# Patient Record
Sex: Male | Born: 1960
Health system: Southern US, Community
[De-identification: ages and names within clinical notes are randomized; demographics above are authoritative.]

## PROBLEM LIST (undated history)

## (undated) DIAGNOSIS — N529 Male erectile dysfunction, unspecified: Secondary | ICD-10-CM

## (undated) DIAGNOSIS — G4730 Sleep apnea, unspecified: Secondary | ICD-10-CM

## (undated) DIAGNOSIS — R079 Chest pain, unspecified: Secondary | ICD-10-CM

## (undated) DIAGNOSIS — E785 Hyperlipidemia, unspecified: Secondary | ICD-10-CM

## (undated) DIAGNOSIS — I10 Essential (primary) hypertension: Secondary | ICD-10-CM

## (undated) DIAGNOSIS — E119 Type 2 diabetes mellitus without complications: Secondary | ICD-10-CM

## (undated) DIAGNOSIS — N189 Chronic kidney disease, unspecified: Secondary | ICD-10-CM

## (undated) DIAGNOSIS — B0223 Postherpetic polyneuropathy: Secondary | ICD-10-CM

## (undated) DIAGNOSIS — E559 Vitamin D deficiency, unspecified: Secondary | ICD-10-CM

## (undated) DIAGNOSIS — I509 Heart failure, unspecified: Secondary | ICD-10-CM

## (undated) HISTORY — DX: Male erectile dysfunction, unspecified: N52.9

## (undated) HISTORY — DX: Vitamin D deficiency, unspecified: E55.9

## (undated) HISTORY — DX: Sleep apnea, unspecified: G47.30

## (undated) HISTORY — DX: Chest pain, unspecified: R07.9

## (undated) HISTORY — DX: Chronic kidney disease, unspecified: N18.9

## (undated) HISTORY — DX: Postherpetic polyneuropathy: B02.23

## (undated) HISTORY — DX: Essential (primary) hypertension: I10

## (undated) HISTORY — DX: Hyperlipidemia, unspecified: E78.5

## (undated) HISTORY — DX: Morbid (severe) obesity due to excess calories: E66.01

## (undated) HISTORY — DX: Type 2 diabetes mellitus without complications: E11.9

---

## 1998-08-31 ENCOUNTER — Encounter: Admission: RE | Admit: 1998-08-31 | Discharge: 1998-11-29 | Payer: Self-pay | Admitting: Family Medicine

## 1999-04-27 ENCOUNTER — Encounter: Admission: RE | Admit: 1999-04-27 | Discharge: 1999-07-26 | Payer: Self-pay | Admitting: Internal Medicine

## 2005-01-15 ENCOUNTER — Inpatient Hospital Stay (HOSPITAL_COMMUNITY): Admission: EM | Admit: 2005-01-15 | Discharge: 2005-01-17 | Payer: Self-pay | Admitting: Emergency Medicine

## 2007-04-08 ENCOUNTER — Ambulatory Visit (HOSPITAL_COMMUNITY): Admission: RE | Admit: 2007-04-08 | Discharge: 2007-04-08 | Payer: Self-pay | Admitting: *Deleted

## 2007-04-19 ENCOUNTER — Encounter: Admission: RE | Admit: 2007-04-19 | Discharge: 2007-04-19 | Payer: Self-pay | Admitting: *Deleted

## 2007-05-07 ENCOUNTER — Ambulatory Visit (HOSPITAL_COMMUNITY): Admission: RE | Admit: 2007-05-07 | Discharge: 2007-05-07 | Payer: Self-pay | Admitting: *Deleted

## 2007-05-24 ENCOUNTER — Inpatient Hospital Stay (HOSPITAL_COMMUNITY): Admission: EM | Admit: 2007-05-24 | Discharge: 2007-05-28 | Payer: Self-pay | Admitting: Emergency Medicine

## 2007-11-13 HISTORY — PX: LAPAROSCOPIC GASTRIC BANDING: SHX1100

## 2007-12-03 ENCOUNTER — Encounter: Admission: RE | Admit: 2007-12-03 | Discharge: 2008-02-04 | Payer: Self-pay | Admitting: *Deleted

## 2007-12-09 ENCOUNTER — Ambulatory Visit (HOSPITAL_COMMUNITY): Admission: RE | Admit: 2007-12-09 | Discharge: 2007-12-10 | Payer: Self-pay | Admitting: *Deleted

## 2008-04-29 ENCOUNTER — Encounter: Admission: RE | Admit: 2008-04-29 | Discharge: 2008-04-29 | Payer: Self-pay | Admitting: *Deleted

## 2009-10-20 IMAGING — RF DG UGI W/ KUB
14 of 16 series · 14 of 16 positions shown · non-contrast
Comparison: none

CLINICAL DATA: Preop screening for bariatric surgery ? no G.I. symptoms. 
UPPER G.I. WITH KUB:
TECHNIQUE: Routine study although somewhat limited due to the patient?s size.

[Series 1: run · 1 of 1 slices shown (1 of 14)]
[im 1/1]
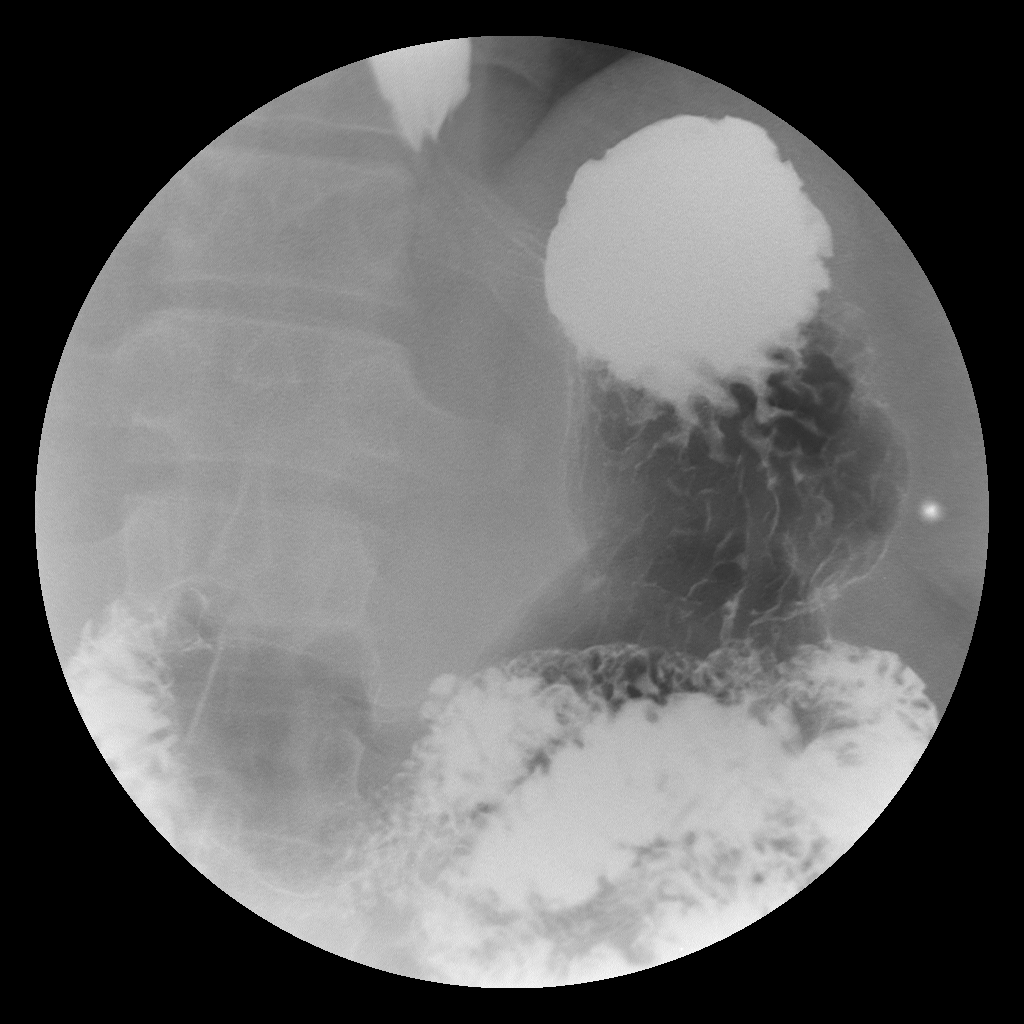

[Series 2: run · 1 of 1 slices shown (2 of 14)]
[im 1/1]
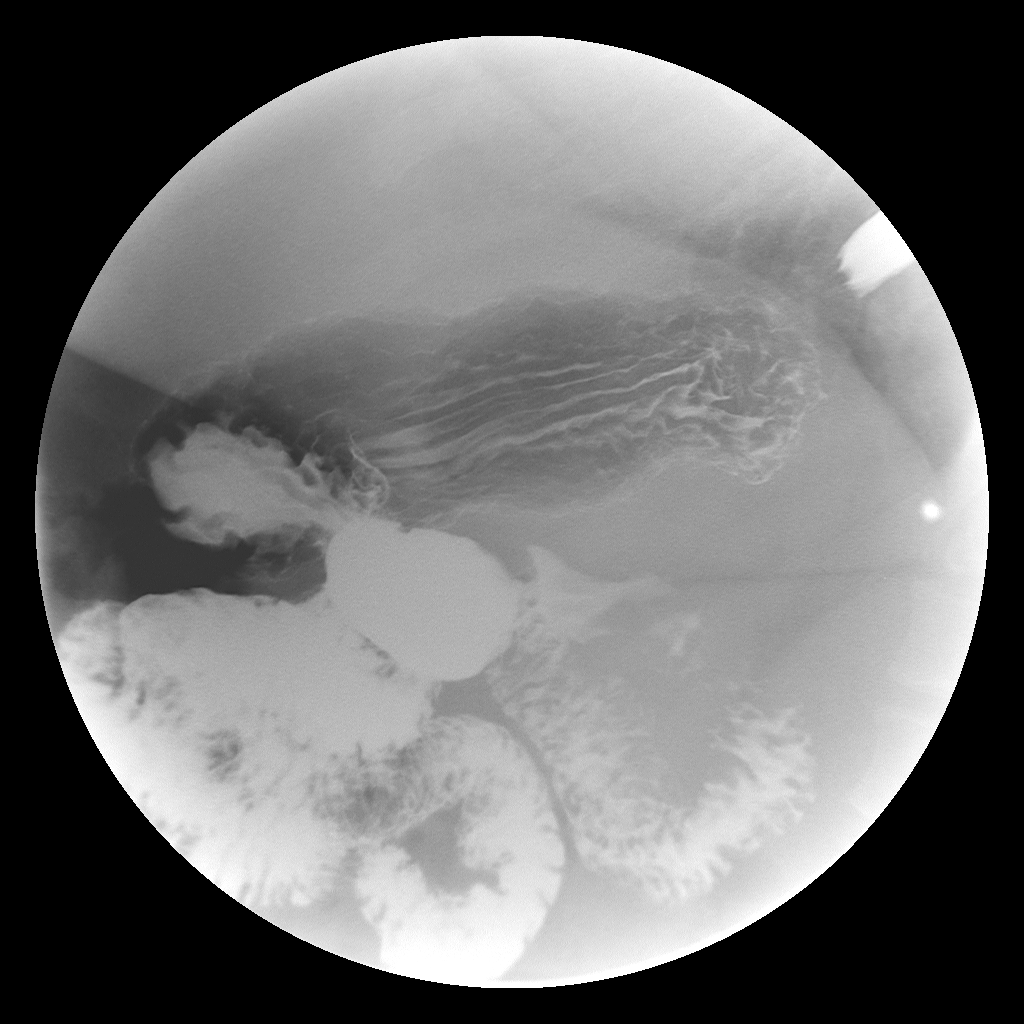

[Series 3: run · 1 of 1 slices shown (3 of 14)]
[im 1/1]
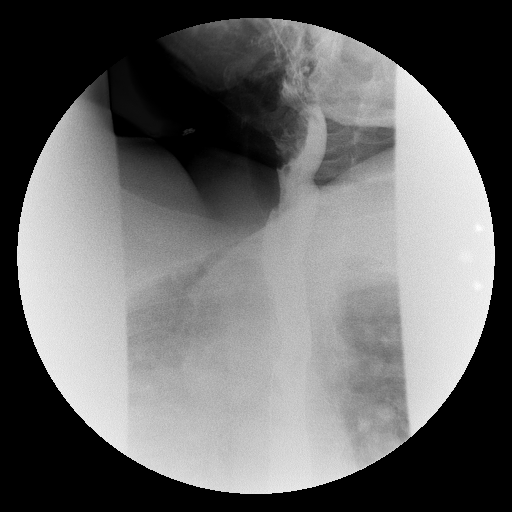

[Series 6: run · 1 of 1 slices shown (4 of 14)]
[im 1/1]
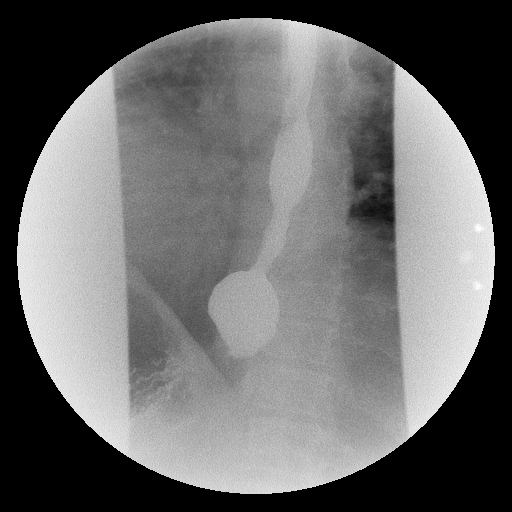

[Series 7: run · 1 of 1 slices shown (5 of 14)]
[im 1/1]
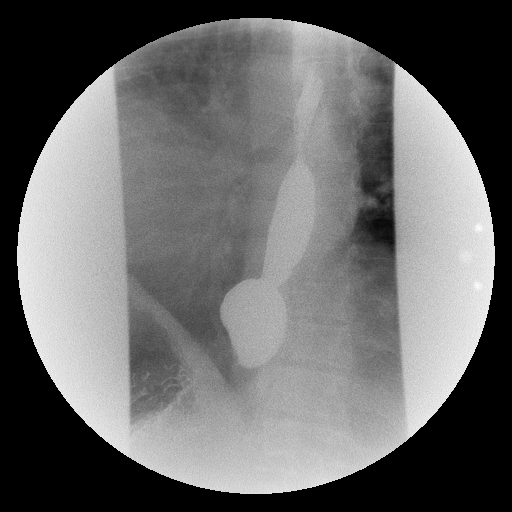

[Series 8: run · 1 of 1 slices shown (6 of 14)]
[im 1/1]
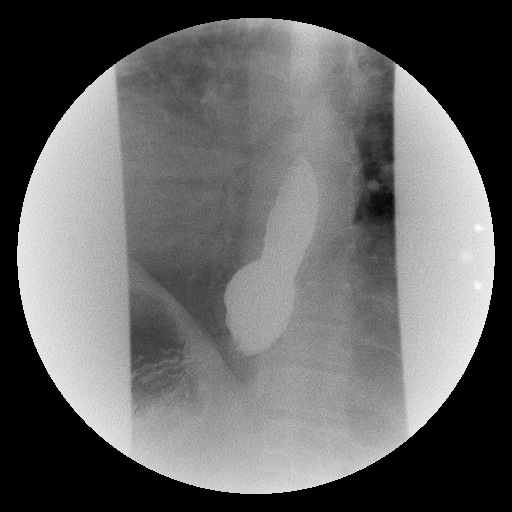

[Series 9: run · 1 of 1 slices shown (7 of 14)]
[im 1/1]
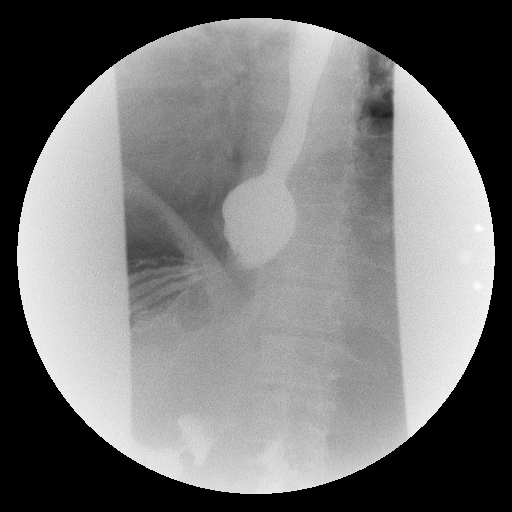

[Series 10: run · 1 of 1 slices shown (8 of 14)]
[im 1/1]
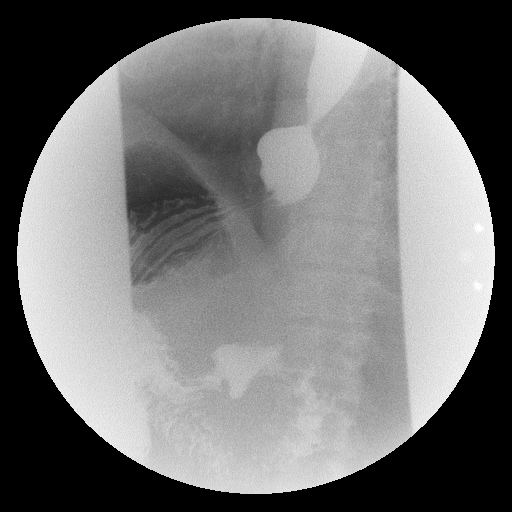

[Series 11: run · 1 of 1 slices shown (9 of 14)]
[im 1/1]
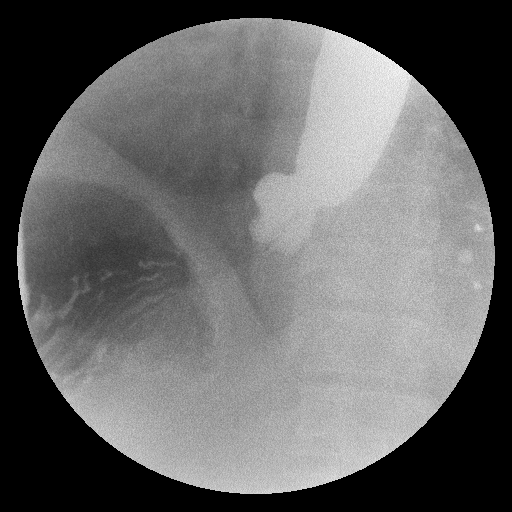

[Series 12: run · 1 of 1 slices shown (10 of 14)]
[im 1/1]
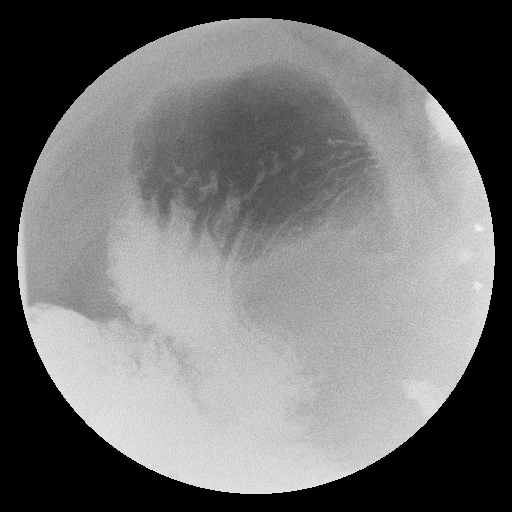

[Series 14: run · 1 of 1 slices shown (11 of 14)]
[im 1/1]
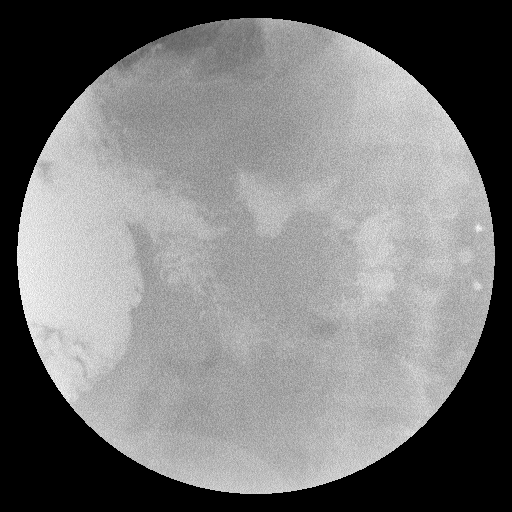

[Series 15: run · 1 of 1 slices shown (12 of 14)]
[im 1/1]
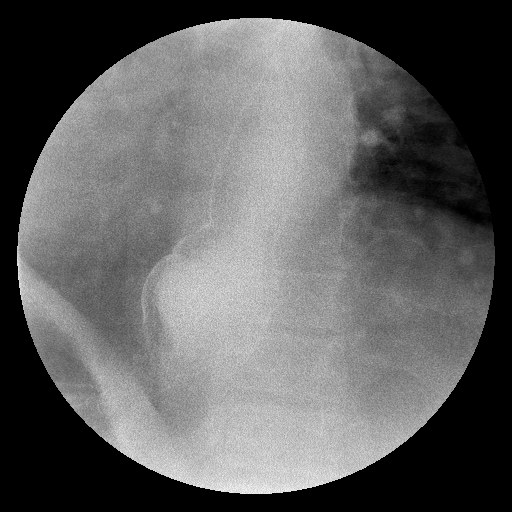

[Series 16: run · 1 of 1 slices shown (13 of 14)]
[im 1/1]
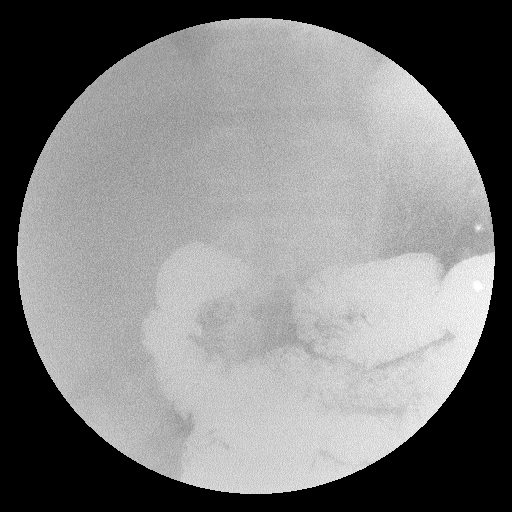

[Series 17: run · 1 of 1 slices shown (14 of 14)]
[im 1/1]
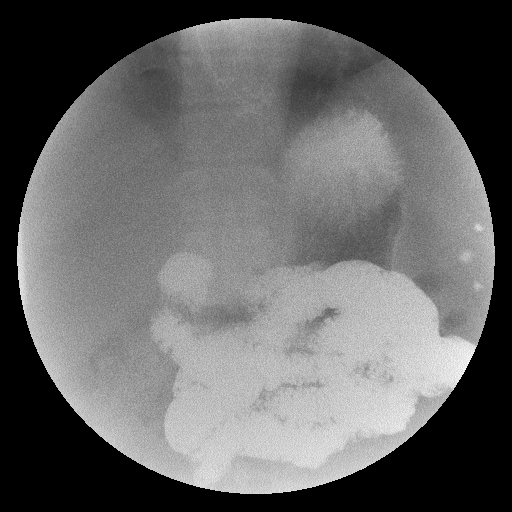

[14 of 16 positions shown; findings below may reference images not displayed]

FINDINGS: Swallowing mechanism normal.  There is rather severe esophageal dysmotility.  There is a small to moderate sized sliding hiatal hernia.  I was unable to demonstrate reflux, stricture, or esophagitis on this limited exam.  Patient?s weight prohibited placing the patient upright.  
The stomach, duodenal bulb, C-loop, and proximal small bowel are normal.
IMPRESSION: Small to moderate sized hiatal hernia with esophageal dysmotility ? no definite reflux, stricture, or esophagitis demonstrated.

## 2010-09-27 NOTE — Op Note (Signed)
NAME:  EYTHAN, JAYNE NO.:  [id number]   MEDICAL RECORD NO.:  [id number]          PATIENT TYPE:  INP   LOCATION:  0007                         FACILITY:  [facility]   PHYSICIAN:  Alfonse Ras, MD   DATE OF BIRTH:  [birth year]   DATE OF PROCEDURE:  12/09/2007  DATE OF DISCHARGE:                               OPERATIVE REPORT   PREOPERATIVE DIAGNOSES:  Medically refractory morbid obesity and  questionable hiatal hernia.   POSTOPERATIVE DIAGNOSES:  Medically refractory morbid obesity and no  evidence of hiatal hernia.   PROCEDURE:  Laparoscopic adjustable gastric banding with an APL system.   ASSISTANT:  Thornton Park. Daphine Deutscher, MD   ANESTHESIA:  General endotracheal tube.   DESCRIPTION:  The patient was taken to the operating room, placed in  supine position.  After adequate general anesthesia was induced using  endotracheal tube, the abdomen was prepped and draped in normal sterile  fashion using a 11 mm OptiVu trocar in the left upper quadrant under  direct vision, peritoneal access was obtained.  Pneumoperitoneum was  obtained.  A 15 mm trocar was placed in the right upper quadrant under  direct vision and an additional 11 mm trocar was placed in the right  abdomen.  A 11 mm trocar was placed in the right supraumbilical left  paramedian position.  Additional 5-mm trocar was placed in the left mid  abdomen.  Nathanson liver retractor was placed in subxiphoid region.  The left lateral segment liver was retracted.  The left lateral segment  appeared to be very thick and boggy and, therefore, I redirected a 50 mm  trocar, however, there was no significant torque and, therefore, I  placed the APL band in to the abdomen and then replaced the 15 mm port  with a 12 mm port placed along 12 mm port placed obliquely.  The angle  of Hiss was sharply and bluntly dissected.  Using the pars flaccida  technique, the area of crossing fat on the right crus of the diaphragm  was  identified.  Sizing balloon was placed down through the esophagus  and into the stomach and insufflated to 15 mL of air and pulled back.  There was no evidence of hiatal hernia and no anterior dimpling.  The  band passer was then placed using pars flaccida technique in the  retrogastric position and brought out at the angle of Hiss and the band  was secured in place over the sizing balloon.  Anterior fundoplication  was performed using interrupted 2-0 Ethilon sutures in the standard  fashion.  Nathanson liver retractor was removed.  Pneumoperitoneum was  released.  The tubing was brought out through the 11 mm trocar in the  right midabdomen attached to the port and secured to the anterior  abdominal wall fascia with interrupted 2-0 Prolene sutures.  After all  trocars were removed, skin incisions were closed with subcuticular for  Monocryl and injected with 0.5 Marcaine.  Sterile dressings were  applied.  The patient tolerated the procedure well, went to PACU in good  condition.      Kristen R.  Colin Benton, MD  Electronically Signed     KRE/MEDQ  D:  12/09/2007  T:  12/10/2007  Job:  [id number]

## 2010-09-27 NOTE — Discharge Summary (Signed)
NAME:  Todd Delgado, Todd Delgado NO.:  [id number]   MEDICAL RECORD NO.:  [id number]          PATIENT TYPE:  INP   LOCATION:  5155                         FACILITY:  [facility]   PHYSICIAN:  Isidor Holts, M.D.  DATE OF BIRTH:  27-Apr-1961   DATE OF ADMISSION:  05/23/2007  DATE OF DISCHARGE:  05/28/2007                               DISCHARGE SUMMARY   PMD:  Dr. Andi Devon.   DISCHARGE DIAGNOSES:  1. Acute pyelonephritis secondary to pansensitive Escherichia coli.  2. Type 2 diabetes mellitus.  3. Morbid obesity.  4. History of sleep apnea syndrome, on nocturnal BiPAP.  5. History of congestive cardiomyopathy.  6. Hypertension.  7. Transient viral enteritis.   DISCHARGE MEDICATIONS:  1. Labetalol 400 mg p.o. b.i.d.  2. Janumet (50/500) one p.o. b.i.d.  3. Tekturna 150 mg p.o. daily.  4. Cartia XT 240 mg p.o. daily.  5. Hyzaar (25/100) one p.o. daily.  6. Clonidine 0.2 mg p.o. q.h.s.  7. Aspirin 81 mg p.o. daily.  8. Multivitamin one p.o. daily.  9. Vitamin C 1000 mg p.o. daily.  10.Ciprofloxacin 500 mg p.o. b.i.d. for seven days only.  11.NPH insulin 14 units subcutaneously b.i.d.  12.Regular insulin, per sliding scale.   PROCEDURES:  Renal ultrasound scan dated May 24, 2007.  This showed  no hydronephrosis or diagnostic calculus. Unremarkable urinary bladder.   CONSULTATIONS:  None.   ADMISSION HISTORY:  As in H&P dated May 23, 2007 dictated by Dr.  Lonia Blood.  However, in brief, this is a 50 year old male, with known  history of type 2 diabetes mellitus, diastolic dysfunction,  hypertension, obstructive sleep apnea syndrome, morbid obesity who  presents with right-sided flank pain of approximately 3-4 days' duration  associated with urinary frequency, nausea.  He did have a mild diarrhea  on day of presentation.  Urinalysis showed a positive urinary sediment.  He was admitted for evaluation, investigation, and management.   CLINICAL COURSE:  1.  Acute pyelonephritis.  For details of presentation refer to      admission history above.  Urinalysis subsequently grew Escherichia      coli which was pansensitive.  The patient was managed with      intravenous fluid hydration and parenteral Ciprofloxacin with good      clinical response, although he did spike a pyrexia of 101 on      May 24, 2007.  Subsequently there was defervescence.  No other      recorded episodes of pyrexia.  Physical well-being improved, and      right loin pain resolved.  By May 27, 2007 white cell count,      which had been originally elevated at 18,000 on the day of      presentation, had dropped down to 7.7.  The patient was therefore      transitioned to oral Ciprofloxacin to complete seven day course of      treatment.   1. Transient viral enteritis.  The patient had no further episodes of      diarrhea, during his hospitalization.  It is likely that his one  day episode of diarrhea was of viral etiology, and self-limiting.   1. Type 2 diabetes mellitus.  The patient remained adequately      controlled with pre-admission diabetic medications.   1. Morbid obesity/sleep apnea syndrome.  The patient was managed with      nocturnal BiPAP during the course of this hospitalization.   1. History of diastolic dysfunction/congestive cardiomyopathy.  No      clinical evidence of congestive heart failure was noted during the      course of this hospitalization.   1. Hypertension.  This was adequately controlled with preadmission      antihypertensive medications.   DISPOSITION:  The patient was on May 28, 2007, considered  sufficiently recovered and stable to be discharged.  As a matter of  fact, he was asymptomatic on that day and was therefore discharged  accordingly.   DIET:  Heart healthy/carbohydrate modified.   ACTIVITY:  As tolerated.   FOLLOWUP INSTRUCTIONS:  The patient is recommended to follow up with his  primary MD, Dr. Andi Devon, per prior scheduled appointment.      Isidor Holts, M.D.  Electronically Signed     CO/MEDQ  D:  05/28/2007  T:  05/28/2007  Job:  [id number]   cc:   Merlene Laughter. Renae Gloss, M.D.

## 2010-09-27 NOTE — H&P (Signed)
NAME:  Todd Delgado, Todd Delgado NO.:  [id number]   MEDICAL RECORD NO.:  [id number]          PATIENT TYPE:  INP   LOCATION:  1827                         FACILITY:  [facility]   PHYSICIAN:  Lonia Blood, M.D.       DATE OF BIRTH:  [birth year]   DATE OF ADMISSION:  05/23/2007  DATE OF DISCHARGE:                              HISTORY & PHYSICAL   PRIMARY CARE PHYSICIAN:  Dr. Andi Devon.   CHIEF COMPLAINT:  Right-sided flank pain.   HISTORY OF PRESENT ILLNESS:  Todd Delgado is a 50 year old African-  American gentleman with past medical history of severe diabetes mellitus  type 2 and hypertension, who presents to the emergency room today  complaining of severe right-sided flank pain for the past 4 days.  The  patient also reports some nausea and sick feeling.  He denies any  vomiting, denies any diarrhea.  He never had any abdominal surgeries.  He reports that about a year ago he suffered a urinary tract infection.   PAST MEDICAL HISTORY:  1. Diabetes mellitus type 2.  2. Diastolic heart failure.  3. Hypertension, stage III.  4. Obstructive sleep apnea.   HOME MEDICATIONS:  1. Insulin NPH 40 units twice a day.  2. Insulin, NovoLog sliding scale with each meal.  3. Cardizem 240 mg daily.  4. Clonidine at bedtime, unknown dose.  5. Labetalol 400 mg twice a day.  6. Tekturna daily.  7. Hyzaar 100/25 mg daily.  8. Aspirin 81 mg daily.   ALLERGIES:  No known drug allergies.   SOCIAL HISTORY:  The patient is married, lives with his wife.  He does  not smoke cigarettes, does not drink any alcohol.  He works as a  Sport and exercise psychologist.   FAMILY HISTORY:  The patient's mother is alive and healthy.  The  patient's father has diabetes and hypertension.  One sister is healthy.  Two children, they are healthy.   REVIEW OF SYSTEMS:  As per HPI.  All other systems have been reviewed  and negative.   PHYSICAL EXAMINATION:  VITAL SIGNS:  Upon admission shows a temperature  99.5,  blood pressure 154/96, pulse 107, respirations 18, saturation 96%  on room air.  GENERAL APPEARANCE:  A morbidly obese gentleman lying in bed, alert and  oriented to place, person and time.  HEENT:  Head:  Normocephalic, atraumatic.  Eyes:  Pupils equal, round,  reactive light and accommodation.  Extraocular was intact.  Throat:  Clear.  NECK: Supple.  No JVD.  CHEST: Clear without wheeze, rhonchi or crackles.  HEART:  Regular rate and rhythm without murmurs, rubs or gallop.  ABDOMEN:  The patient's abdomen is obese and soft, nontender.  Bowel  sounds are present.  GENITOURINARY:  There is positive right CVA tenderness.  EXTREMITIES:  Lower extremities without edema.  SKIN:  Warm, dry without any suspicious rashes.   LABORATORY VALUES:  On admission urinalysis is positive for nitrite and  leukocyte esterase, positive for bacteria and red blood cells.  White  blood cell count is 18,000, hemoglobin 13, platelet count 428.  Sodium  137,  potassium 3.7, chloride 106, BUN 16, glucose 286, creatinine 1.5.   ASSESSMENT AND PLAN:  A diabetic gentleman with acute pyelonephritis.  He will be placed on observation.  Urine culture will be obtained.  Intravenous  fluids will be started.  I will choose for now  Ciprofloxacin as it is conceivable that Enterococcus could also be the  pathogen.  As soon as the urine culture reading points towards gram-  negative rods, the antibiotic could be switched to either intravenous  Rocephin or if an oral antibiotic needs to be used, I think Septra would  be a decent one.  In regards to the patient's diabetes and hypertension,  we will continue the home medications without any changes.      Lonia Blood, M.D.  Electronically Signed     SL/MEDQ  D:  05/24/2007  T:  05/24/2007  Job:  [id number]   cc:   Merlene Laughter. Renae Gloss, M.D.

## 2010-09-30 NOTE — H&P (Signed)
NAME:  JYAIR, KIRALY NO.:  [id number]   MEDICAL RECORD NO.:  [id number]          PATIENT TYPE:  INP   LOCATION:  0102                         [facility]ACILITY:  [facility]   PHYSICIAN:  Lonia Blood, M.D.       DATE OF BIRTH:  1960/07/14   DATE OF ADMISSION:  01/15/2005  DATE OF DISCHARGE:                                HISTORY & PHYSICAL   PRIMARY PHYSICIAN:  Andi Devon, M.D.   CHIEF COMPLAINT:  Dyspnea.   HISTORY OF PRESENT ILLNESS:  Mr. Hudspeth is a 50 year old African-American  man with a history of diabetes mellitus and hypertension who was not taking  any of his blood pressure medications, who came to the emergency room the  night of admission for dyspnea.  He has had a nagging dry cough for the past  2 weeks that did not respond to any empiric therapies.  He also has been  awakening for the past 2 nights with dyspnea and with needing to sit up in  the bed and try to catch his breath.  He denies any chest pain, denies any  fever, or cough with sputum production.  He has never had this episode  before.  He does not know of any history of congestive heart failure.   PAST MEDICAL HISTORY:  1.  Diabetes mellitus since 1997.  2.  Hypertension.   PAST SURGICAL HISTORY:  He has never had any surgeries.   FAMILY HISTORY:  His mother is alive and healthy.  His father has diabetes  and hypertension.  He has one sister that his healthy.  He has two children  that are healthy.   SOCIAL HISTORY:  The patient is married, lives with his wife.  Does not  smoke cigarettes, does not drink any alcohol.  He works in Advertising account planner.   REVIEW OF SYSTEMS:  CONSTITUTIONAL:  Denies any fever, weight loss, fatigue,  or appetite changes.  EYES:  Denies any vision changes, pain, or discharge.  EAR, NOSE, AND THROAT:  Denies any ear ring, nose bleed, sore throat, or  hearing loss.  CARDIOVASCULAR:  Per HPI.  RESPIRATORY:  Per HPI.  GASTROINTESTINAL:  Denies any nausea,  vomiting, diarrhea, constipation,  bleeding, dysphagia, or odynophagia.  GENITOURINARY:  Denies any dysuria,  flank pain, hematuria, or incontinence.  MUSCULOSKELETAL:  Denies any joint  pain, swelling, muscle pain, or back pain.  SKIN:  Denies any rash.  NEUROLOGICAL:  Denies any focal weakness, numbness, vertigo, walking or  speech troubles.  ENDOCRINE:  Denies any polyuria or polydipsia.  Claims his  diabetes is under very good control.  LYMPHATIC SYSTEM:  Denies any  lymphadenopathy.  PSYCHIATRIC SYSTEM:  Denies any depression, anxiety,  hallucinations, or suicidal ideations.   ALLERGIES:  NO KNOWN DRUG ALLERGIES.   PHYSICAL EXAMINATION:  VITAL SIGNS:  Temperature 97.9, blood pressure  213/144, pulse 98, respirations 22, saturating 91% on room air.  GENERAL:  This patient is obese, does not appear chronically ill.  He is  alert and oriented.  HEENT:  His eyes:  Pupils are equal, round, and reactive to light  and  accommodation.  Extraocular movements intact.  There is no scleral icterus.  Ear, nose, and throat:  He has intact hearing.  Sinus clear.  Mouth is  without ulceration.  He has no cavities.  NECK:  Very thick, short, hard to appreciate JVD.  There is no thyromegaly.  No palpable lymphadenopathy.  RESPIRATORY:  He has good efforts, prolonged expirations, crackles at the  bases.  CARDIOVASCULAR:  Very distant heart sounds.  Tachycardic and regular without  murmurs, rubs, or gallops.  GASTROINTESTINAL:  He has an obese abdomen but soft, nontender.  Bowel  sounds are present.  LYMPHATIC:  He has no palpable lymphadenopathy.  MUSCULOSKELETAL:  Intact bulk and tone.  SKIN:  There are no visible rashes, no skin discoloration.  NEUROLOGICAL:  Cranial nerves III-XII intact.  Deep tendon reflexes  symmetric.  Sensorium is intact.  PSYCHIATRIC:  He has good insight, orientation, memory, and affect.   ADMISSION LABORATORY DATA:  Portable chest x-ray shows cardiomegaly,  vascular  congestion, and pulmonary edema.  White blood cell count 9,000,  hemoglobin 14, hematocrit 41.5.  Sodium 139, potassium 3.3, chloride 106,  bicarb 24, glucose 150, BUN 13, creatinine 1.1.  Calcium 8.9.  BNP of less  than 30.  First set of cardiac markers with myoglobin, CK, and troponin I  within normal limits.  EKG shows sinus tachycardia, no acute STT changes.   ASSESSMENT AND PLAN:  Problem 1.  Dyspnea.  The patient differential  diagnosis is hypertensive-related volume overload with heart strain versus  pulmonary embolus versus pneumonia.  At this point, the most likely  explanation for this is uncontrolled hypertension with probably diastolic  heart failure.  The plan is to admit this gentleman to the step-down unit,  diurese him, start him on intravenous nitroglycerin.  We will obtain a 2-D  echo to measure the ejection fraction.  We will also cycle enzymes to rule  out an acute coronary event.  We will also make a TSH to make sure this  patient does not have any hyperthyroidism.   Problem 2.  Diabetes mellitus.  We will keep this patient on his home  regimen of insulin which is 40 units of N b.i.d. and sliding scale insulin  R.  We will hold the ___________ for now.   Problem 3.  Hypokalemia.  We will replace his potassium.           ______________________________  Lonia Blood, M.D.     SL/MEDQ  D:  01/15/2005  T:  01/15/2005  Job:  [id number]   cc:   Merlene Laughter. Renae Gloss, M.D.  [address]  [address]  [place]  [place] [address]  Fax: [phone]

## 2010-09-30 NOTE — Discharge Summary (Signed)
NAME:  Todd Delgado, BLIZARD NO.:  [id number]   MEDICAL RECORD NO.:  [id number]          PATIENT TYPE:  INP   LOCATION:  0153                         FACILITY:  [facility]   PHYSICIAN:  Hettie Holstein, D.O.    DATE OF BIRTH:  1960/07/20   DATE OF ADMISSION:  01/15/2005  DATE OF DISCHARGE:                                 DISCHARGE SUMMARY   PRIMARY CARE PHYSICIAN:  Dr. Andi Devon   ADMISSION DIAGNOSES:  1.  Dyspnea.  2.  Suspected congestive heart failure.   DISCHARGE DIAGNOSES:  1.  Congestive heart failure suspected to be related to hypertensive      urgency, resolved this admission with adequate blood pressure control      and diuresis. Mr. Fell did not have evidence of acute ischemia during      this hospitalization. He underwent 2-D echocardiogram for which the      reports will be available to his primary care physician, Dr. Andi Devon, as he will be discharged prior to the results of this study.  2.  Morbid obesity.  3.  Clinically suspected obstructive sleep apnea.  4.  Diabetes mellitus.   MEDICATIONS ON DISCHARGE:  The patient has been initiated on:  1.  Labetalol 200 mg twice daily.  2.  Catapres he was to continue as he was on at home, 0.1 mg b.i.d.  3.  Cozaar 25 mg daily.   He is instructed to resume Byetta and his home insulin regimen as before of  NPH 40 units twice daily and a regular insulin scale which he was on prior  to admission. Continue Byetta as he was on before as well.   DISPOSITION:  He is instructed to follow up with Dr. Andi Devon for  results of his 2-D echocardiogram this week, and also to follow up on blood  pressure control.   HISTORY OF PRESENTING ILLNESS:  For full details, please refer to the H&P as  dictated by Dr. Lonia Blood. However, briefly, Mr. Knierim is a 50 year old  African-American male with a history of diabetes and hypertension who was  not taking his blood pressure medications as he has been  undergoing some  transitions due to infertility management and for this reason his blood  pressure was out of control. Had some shortness of breath and he was found  to be hypoxic in the emergency department with radiographic suggestion of  congestive heart failure.   HOSPITAL COURSE:  He was admitted for hypertensive urgency and management of  his congestive heart failure with presenting blood pressure 213/144. He did  achieve adequate blood pressure control and was transitioned to oral  therapies including labetalol. He did report a history of a cough with a  medication which he cannot recall, though he did mention lisinopril and it  is suspected that he may have developed an ACE cough. I do not have these  details. I am starting him on Cozaar at this time and he can be followed by  his primary care physician, Dr. Renae Gloss, for further  tailoring of his  medications. He has remained stable throughout his course  and is undergoing 2-D echocardiogram, and afterwards will be discharged in  stable condition. His BNP has remained nonelevated throughout his hospital  course.      Hettie Holstein, D.O.  Electronically Signed     ESS/MEDQ  D:  01/17/2005  T:  01/17/2005  Job:  [id number]   cc:   Merlene Laughter. Renae Gloss, M.D.  [address]  [address]  [place]  [place] [address]  Fax: [phone]

## 2011-02-02 LAB — CBC
HCT: 35.6 — ABNORMAL LOW
HCT: 36.1 — ABNORMAL LOW
HCT: 36.8 — ABNORMAL LOW
HCT: 39.2
HCT: 40
Hemoglobin: 11.9 — ABNORMAL LOW
Hemoglobin: 12.2 — ABNORMAL LOW
Hemoglobin: 12.4 — ABNORMAL LOW
Hemoglobin: 13.2
MCHC: 33.4
MCHC: 33.6
MCHC: 33.8
MCV: 80.6
Platelets: 317
Platelets: 428 — ABNORMAL HIGH
Platelets: 433 — ABNORMAL HIGH
RBC: 4.41
RBC: 4.86
RDW: 13.7
RDW: 13.9
WBC: 14.4 — ABNORMAL HIGH
WBC: 21.3 — ABNORMAL HIGH

## 2011-02-02 LAB — URINE MICROSCOPIC-ADD ON

## 2011-02-02 LAB — I-STAT 8, (EC8 V) (CONVERTED LAB)
Acid-Base Excess: 1
BUN: 16
Bicarbonate: 23.4
Chloride: 106
Glucose, Bld: 286 — ABNORMAL HIGH
HCT: 45
Hemoglobin: 15.3
Operator id: 282201
Potassium: 3.7
Sodium: 137
TCO2: 24
pCO2, Ven: 30.4 — ABNORMAL LOW
pH, Ven: 7.495 — ABNORMAL HIGH

## 2011-02-02 LAB — DIFFERENTIAL
Basophils Absolute: 0
Basophils Relative: 0
Eosinophils Absolute: 0
Eosinophils Absolute: 0
Eosinophils Relative: 0
Lymphocytes Relative: 5 — ABNORMAL LOW
Lymphs Abs: 1.1
Monocytes Absolute: 0.2
Monocytes Absolute: 1
Monocytes Relative: 1 — ABNORMAL LOW
Neutro Abs: 20 — ABNORMAL HIGH
Neutrophils Relative %: 94 — ABNORMAL HIGH
WBC Morphology: INCREASED

## 2011-02-02 LAB — BASIC METABOLIC PANEL
BUN: 13
CO2: 24
CO2: 27
Calcium: 8.4
Calcium: 8.6
Calcium: 9.2
Chloride: 105
Chloride: 106
Creatinine, Ser: 1.37
GFR calc Af Amer: >60
GFR calc Af Amer: >60
GFR calc non Af Amer: 56 — ABNORMAL LOW
GFR calc non Af Amer: 57 — ABNORMAL LOW
Glucose, Bld: 228 — ABNORMAL HIGH
Glucose, Bld: 299 — ABNORMAL HIGH
Potassium: 3.4 — ABNORMAL LOW
Sodium: 137

## 2011-02-02 LAB — BASIC METABOLIC PANEL WITH GFR
BUN: 14
Chloride: 105
Creatinine, Ser: 1.35
GFR calc Af Amer: >60
Potassium: 3.7

## 2011-02-02 LAB — POCT I-STAT CREATININE
Creatinine, Ser: 1.5
Operator id: 282201

## 2011-02-02 LAB — URINALYSIS, ROUTINE W REFLEX MICROSCOPIC
Bilirubin Urine: NEGATIVE
Glucose, UA: NEGATIVE
Nitrite: POSITIVE — AB
Urobilinogen, UA: 0.2
pH: 5.5

## 2011-02-02 LAB — URINE CULTURE: Colony Count: >=100000

## 2011-02-10 LAB — DIFFERENTIAL
Basophils Relative: 0
Eosinophils Absolute: 0
Lymphs Abs: 1
Monocytes Relative: 5
Neutro Abs: 10.9 — ABNORMAL HIGH
Neutrophils Relative %: 86 — ABNORMAL HIGH

## 2011-02-10 LAB — COMPREHENSIVE METABOLIC PANEL
ALT: 112 — ABNORMAL HIGH
AST: 88 — ABNORMAL HIGH
CO2: 24
Calcium: 9.3
Creatinine, Ser: 1.2
GFR calc Af Amer: >60
GFR calc non Af Amer: >60
Glucose, Bld: 128 — ABNORMAL HIGH
Total Protein: 7.7

## 2011-02-10 LAB — GLUCOSE, CAPILLARY
Glucose-Capillary: 168 — ABNORMAL HIGH
Glucose-Capillary: 185 — ABNORMAL HIGH
Glucose-Capillary: 186 — ABNORMAL HIGH

## 2011-02-10 LAB — CBC
Platelets: 402 — ABNORMAL HIGH
RBC: 4.58
WBC: 12.6 — ABNORMAL HIGH

## 2011-02-10 LAB — HEMOGLOBIN AND HEMATOCRIT, BLOOD: HCT: 39.9

## 2011-07-04 ENCOUNTER — Telehealth (INDEPENDENT_AMBULATORY_CARE_PROVIDER_SITE_OTHER): Payer: Self-pay | Admitting: Surgery

## 2011-07-04 NOTE — Telephone Encounter (Signed)
07/04/11 mailed recall letter for bariatric surgery f/u to pt. Adv pt to call [facility] @ [phone] to schedule an appointment...cef °

## 2012-06-04 ENCOUNTER — Telehealth (INDEPENDENT_AMBULATORY_CARE_PROVIDER_SITE_OTHER): Payer: Self-pay | Admitting: Surgery

## 2012-06-04 NOTE — Telephone Encounter (Signed)
06/04/12 pt not reachable by phone. Mailed recall letter to follow-up bariatric surgery. Lap band surgery 12/09/07. (lss)

## 2012-07-15 ENCOUNTER — Ambulatory Visit (INDEPENDENT_AMBULATORY_CARE_PROVIDER_SITE_OTHER): Payer: BC Managed Care – PPO | Admitting: Surgery

## 2012-07-15 ENCOUNTER — Encounter (INDEPENDENT_AMBULATORY_CARE_PROVIDER_SITE_OTHER): Payer: Self-pay | Admitting: Surgery

## 2012-07-15 VITALS — BP 154/70 | HR 64 | Temp 97.2°F | Resp 16 | Ht 70.5 in | Wt 361.0 lb

## 2012-07-15 DIAGNOSIS — Z9884 Bariatric surgery status: Secondary | ICD-10-CM

## 2012-07-15 NOTE — Patient Instructions (Signed)
Laparoscopic Gastric Band Surgery Care After These instructions give you information on caring for yourself after your procedure. Your doctor may also give you more specific instructions. Call your doctor if you have any problems or questions after your procedure. HOME CARE   Take walks throughout the day. Do not sit for longer than 1 hour while awake for 4 to 6 weeks.  You may shower 2 days after surgery. Pat the surgery cuts (incisions) dry. Do not rub the surgery cuts.  Do your coughing and deep breathing exercises.  Do not lift, push, or pull anything heavy until your doctor says it is okay.  Only take medicines as told by your doctor. Do not drive while taking pain medicine.  Drink plenty of fluids to keep your pee (urine) clear or pale yellow.  Stay on a clear liquid diet as long as your doctor tells you to.  Do not drink caffeine for 1 month.  Change bandages (dressings) as told by your doctor.  Check your surgery cuts for redness, pufffiness (swelling), abnormal coloring, fluid, or bleeding.  Follow your doctor's advice about vitamin and protein needs after surgery. GET HELP RIGHT AWAY IF:  You feel sick to your stomach (nauseous) and throw up (vomit).  You have pain and discomfort with swallowing.  You develop shortness of breath or difficulty breathing.  You have pain, puffiness, or feel warmth on your lower body.  You have very bad calf pain or pain not relieved by medicine.  You have a temperature by mouth above 102 F (38.9 C).  Your surgery cuts look red, puffy, or they leak fluid.  Your poop (stool) is black, tarry, or dark red.  You have chills.  You have chest pain.  You feel confused.  You have slurred speech.  You feel lightheaded when standing.  You suddenly feel weak.  You have any questions or concerns. MAKE SURE YOU:   Understand these instructions.  Will watch your condition.  Will get help right away if you are not doing well or  get worse. Document Released: 06/03/2010 Document Revised: 07/24/2011 Document Reviewed: 06/03/2010 [employer] Patient Information 2013 [employer].  

## 2012-07-15 NOTE — Progress Notes (Signed)
  Todd Delgado is seen today in followup. He is been abstinent for 4 years. I think some of that had to do with difficulty accessing his lap band port. He is a broad chested large man who is by the numbers only lost about 27 pounds since his surgery. He does however work out regularly and is very busy in the business world. After talking with him I did attempt to access is poor. He was very uncomfortable and nervous. It is a fascially fixed port and it is difficult to hit. If we were to use this more regular think I would want to place it in a subcutaneous location.  He asked about vagotomy and I advised that we were not recommending this.   In the meantime I think he could benefit from seeing Maralyn Sago and having some dietary intervention as well as assessing his muscle mass and fat mass. I will make arrangements for her to see him  back in 3 months.

## 2012-07-16 ENCOUNTER — Other Ambulatory Visit (INDEPENDENT_AMBULATORY_CARE_PROVIDER_SITE_OTHER): Payer: Self-pay

## 2012-08-03 ENCOUNTER — Emergency Department (HOSPITAL_COMMUNITY): Payer: BC Managed Care – PPO

## 2012-08-03 ENCOUNTER — Emergency Department (HOSPITAL_COMMUNITY)
Admission: EM | Admit: 2012-08-03 | Discharge: 2012-08-03 | Disposition: A | Discharge status: Home / Self Care | Payer: BC Managed Care – PPO | Attending: Emergency Medicine | Admitting: Emergency Medicine

## 2012-08-03 ENCOUNTER — Encounter (HOSPITAL_COMMUNITY): Payer: Self-pay | Admitting: Adult Health

## 2012-08-03 DIAGNOSIS — Z79899 Other long term (current) drug therapy: Secondary | ICD-10-CM | POA: Insufficient documentation

## 2012-08-03 DIAGNOSIS — R0602 Shortness of breath: Secondary | ICD-10-CM | POA: Insufficient documentation

## 2012-08-03 DIAGNOSIS — R079 Chest pain, unspecified: Secondary | ICD-10-CM

## 2012-08-03 DIAGNOSIS — Z794 Long term (current) use of insulin: Secondary | ICD-10-CM | POA: Insufficient documentation

## 2012-08-03 DIAGNOSIS — R0789 Other chest pain: Secondary | ICD-10-CM | POA: Insufficient documentation

## 2012-08-03 DIAGNOSIS — I509 Heart failure, unspecified: Secondary | ICD-10-CM | POA: Insufficient documentation

## 2012-08-03 DIAGNOSIS — I10 Essential (primary) hypertension: Secondary | ICD-10-CM | POA: Insufficient documentation

## 2012-08-03 DIAGNOSIS — E119 Type 2 diabetes mellitus without complications: Secondary | ICD-10-CM | POA: Insufficient documentation

## 2012-08-03 HISTORY — DX: Heart failure, unspecified: I50.9

## 2012-08-03 LAB — BASIC METABOLIC PANEL
BUN: 16 mg/dL (ref 6–23)
Chloride: 103 mEq/L (ref 96–112)
GFR calc Af Amer: 74 mL/min — ABNORMAL LOW (ref >90)
GFR calc non Af Amer: 64 mL/min — ABNORMAL LOW (ref >90)
Potassium: 3.5 mEq/L (ref 3.5–5.1)
Sodium: 141 mEq/L (ref 135–145)

## 2012-08-03 LAB — CBC
HCT: 41 % (ref 39.0–52.0)
Hemoglobin: 14.1 g/dL (ref 13.0–17.0)
MCHC: 34.4 g/dL (ref 30.0–36.0)
RDW: 13.5 % (ref 11.5–15.5)
WBC: 7.9 10*3/uL (ref 4.0–10.5)

## 2012-08-03 LAB — POCT I-STAT TROPONIN I
Troponin i, poc: 0 ng/mL (ref 0.00–0.08)
Troponin i, poc: 0.01 ng/mL (ref 0.00–0.08)

## 2012-08-03 NOTE — ED Provider Notes (Addendum)
History     CSN: [id number]  Arrival date & time 08/03/12  1191   First MD Initiated Contact with Patient 08/03/12 1951      Chief Complaint  Patient presents with  . Chest Pain    (Consider location/radiation/quality/duration/timing/severity/associated sxs/prior treatment) The history is provided by the patient and the spouse.  Todd Delgado is a 52 y.o. male history of diabetes, hypertension, CHF here presenting with chest pain. Intermittent chest pain for the last 3 days. He worked out a lot 4 days ago in [employer] The chest pain is left-sided and intermittent. It was a sense of soreness and sometimes radiate to the back since yesterday. He also noticed that his blood pressure has been running higher and has been taking more of his antihypertensive medicines. Denies any cardiac history or CAD or stents. He had some SOB that improved. Went to urgent care was sent in for evaluation.    Past Medical History  Diagnosis Date  . Diabetes mellitus without complication   . Hypertension   . CHF (congestive heart failure)     Past Surgical History  Procedure Laterality Date  . Laparoscopic gastric banding      Family History  Problem Relation Age of Onset  . Heart disease Father   . Diabetes Father     History  Substance Use Topics  . Smoking status: Never Smoker   . Smokeless tobacco: Never Used  . Alcohol Use: Yes      Review of Systems  Cardiovascular: Positive for chest pain.  All other systems reviewed and are negative.    Allergies  Review of patient's allergies indicates no known allergies.  Home Medications   Current Outpatient Rx  Name  Route  Sig  Dispense  Refill  . cloNIDine (CATAPRES) 0.1 MG tablet   Oral   Take 0.1 mg by mouth at bedtime.         Marland Kitchen diltiazem (CARDIZEM CD) 240 MG 24 hr capsule   Oral   Take 240 mg by mouth at bedtime.          . insulin detemir (LEVEMIR) 100 UNIT/ML injection   Subcutaneous   Inject 30 Units into the  skin at bedtime.          . insulin NPH (HUMULIN N,NOVOLIN N) 100 UNIT/ML injection   Subcutaneous   Inject 50 Units into the skin daily before breakfast. Per sliding scale         . insulin regular (NOVOLIN R,HUMULIN R) 100 units/mL injection   Subcutaneous   Inject 10 Units into the skin 3 (three) times daily before meals. Per sliding scale         . labetalol (NORMODYNE) 300 MG tablet   Oral   Take 600 mg by mouth 2 (two) times daily.          . metFORMIN (GLUMETZA) 500 MG (MOD) 24 hr tablet   Oral   Take 500 mg by mouth daily with breakfast.         . simvastatin (ZOCOR) 40 MG tablet   Oral   Take 40 mg by mouth every evening.         . valsartan-hydrochlorothiazide (DIOVAN-HCT) 320-25 MG per tablet   Oral   Take 1 tablet by mouth daily.           BP 176/93  Pulse 88  Temp(Src) 98.7 F (37.1 C) (Oral)  Resp 18  SpO2 97%  Physical Exam  Nursing note and  vitals reviewed. Constitutional: He is oriented to person, place, and time. He appears well-developed and well-nourished.  Obese, NAD   HENT:  Head: Normocephalic.  Mouth/Throat: Oropharynx is clear and moist.  Eyes: Conjunctivae are normal. Pupils are equal, round, and reactive to light.  Neck: Normal range of motion. Neck supple.  Cardiovascular: Normal rate, regular rhythm and normal heart sounds.   Pulmonary/Chest: Effort normal and breath sounds normal. No respiratory distress. He has no wheezes. He has no rales.  Mild reproducible tenderness on L chest   Abdominal: Soft. Bowel sounds are normal. He exhibits no distension. There is no tenderness. There is no rebound and no guarding.  Musculoskeletal: Normal range of motion. He exhibits no edema and no tenderness.  Neurological: He is alert and oriented to person, place, and time.  Skin: Skin is warm and dry.  Psychiatric: He has a normal mood and affect. His behavior is normal. Judgment and thought content normal.    ED Course  Procedures  (including critical care time)  Labs Reviewed  BASIC METABOLIC PANEL - Abnormal; Notable for the following:    Glucose, Bld 120 (*)    GFR calc non Af Amer 64 (*)    GFR calc Af Amer 74 (*)    All other components within normal limits  CBC  PRO B NATRIURETIC PEPTIDE  POCT I-STAT TROPONIN I  POCT I-STAT TROPONIN I   Dg Chest Port 1 View  08/03/2012  *RADIOLOGY REPORT*  Clinical Data: Chest pain and short of breath  PORTABLE CHEST - 1 VIEW  Comparison: 12/03/2007  Findings: Cardiac enlargement without heart failure.  Negative for pneumonia or effusion.  Lungs are clear.  IMPRESSION: No acute cardiopulmonary abnormality.   Original Report Authenticated By: Janeece Riggers, M.D.      1. Chest pain       Date: 08/03/2012  Rate: 88  Rhythm: normal sinus rhythm  QRS Axis: normal  Intervals: normal  ST/T Wave abnormalities: nonspecific ST changes and early repolarization  Conduction Disutrbances:none  Narrative Interpretation:   Old EKG Reviewed: none available     MDM  ABDULKARIM Delgado is a 52 y.o. male here with chest pain. Atypical in nature, likely MSK. Will get trop x 2. Will get BNP given hx of CHF and he had some SOB. Not concerned for PE. He has cardiology f/u in 2 days so if labs nl, can get outpatient work up.   11:00 PM CXR nl. Trop neg x 2. I recommend tylenol, motrin. He will see his cardiologist in 2 days.         Richardean Canal, MD 08/03/12 [id number]  Richardean Canal, MD 08/03/12 [phone]

## 2012-08-03 NOTE — ED Notes (Signed)
Pt. States on Thursday noticed left chest pain radiating to left arm and back. States pain is soreness. Today had increased BP so took extra labetalol.. Pt. States took 324 ASA today.

## 2012-08-03 NOTE — ED Notes (Addendum)
Presents with left sided intermittent chest pain that radiates straight through to back described as soreness associated with SOB and HTN began Thursday afternoon. SOB is with exertion. Pain gets better while at rest.  Sent over from [facility] with EKG changes. Denies blurred vision, denies dizziness. Pt has been having difficulty with BP and has increased BP medications on own due to elevation.

## 2012-08-07 ENCOUNTER — Encounter: Payer: Self-pay | Admitting: *Deleted

## 2012-08-07 ENCOUNTER — Encounter: Payer: BC Managed Care – PPO | Attending: Surgery | Admitting: *Deleted

## 2012-08-07 DIAGNOSIS — Z713 Dietary counseling and surveillance: Secondary | ICD-10-CM | POA: Insufficient documentation

## 2012-08-07 NOTE — Progress Notes (Signed)
  Follow-up visit:  2.5 Years Post-Operative LAGB Surgery  Medical Nutrition Therapy:  Appt start time: 1115 end time:  1215.  Primary concerns today: Post-operative Bariatric Surgery Nutrition Management. Todd Delgado comes today with 34 lb wt gain for nutrition refresher. High CHO intake noted at times. Protein intake low. 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 States he know what he needs to do, he just has to get back on track.    Surgery date: 11/2007 Surgery type: LAGB (Dr. Grier Mitts) Start weight: 416 lbs  Last patient reported weight: 325 lbs (09/12) Weight today: 359.5 lbs Total weight lost: n/a  Weight goal:  1st) 325 lbs   2nd) <300 lbs  3rd) 285 lbs  TANITA  BODY COMP RESULTS  3.26.14   BMI (kg/m^2) 50.1   Fat Mass (lbs) 168.5   Fat Free Mass (lbs) 191.0   Total Body Water (lbs) 140.0   24-hr recall: B (AM): [employer] egg white & veggie sandwich on flatbread - 8-10g Snk (AM): Dried green beans  L (PM): [employer] salad w/ steak and pepperjack cheese, ranch dressing (FF) - 20g Snk (PM):  Veggie straws (16 ea) D (PM): Corned beef, cabbage, and fish taco + occasional desserts (strawberry/mango pie - ~3 days/week) = 15-20g Snk (PM): NONE  Fluid intake: > 60 oz Estimated total protein intake: 50-60 g  Medications: See medication list Supplementation:  Taking  CBG monitoring: Most days Average CBG per patient: 100 mg (FBG); 120 mg (pre-prandial) Last patient reported A1c:  Not available; Pt states was "increased last time"  Using straws: No Drinking while eating: Unknown Hair loss: No Carbonated beverages: No N/V/D/C: No Last Lap-Band fill:  Difficult to find port  Recent physical activity:  5-6 days/week - rides bike (on bike trainer indoors) and lifts weights (4 day/wk alone; 1 day/wk with trainer)  Progress Towards Goal(s):  In progress.   Nutritional Diagnosis:   [place]-3.4 Unintentional weight gain As related to poor food choices s/p LAGB.  As evidenced by unintended weight gain of 34 lbs and a BMI of 50.1 kg/m^2  .    Intervention:  Nutrition education/reinforcement.  Monitoring/Evaluation:  Dietary intake, exercise, lap band fills, and body weight. Follow up in 4 weeks.

## 2012-08-07 NOTE — Patient Instructions (Addendum)
Goals:  Follow Phase 3B: High Protein + Non-Starchy Vegetables  Aim for maximum of 15 grams of carbs per meal/15 grams   Avoid white starches and starchy veggies (potatoes, peas, corn, etc)  Avoid sweetened drinks  Eat 3-6 small meals/snacks, every 3-5 hrs  Increase lean protein foods to meet 60-80g goal  Increase fluid intake to 64oz +  Avoid drinking 15 minutes before, during and 30 minutes after eating  Continue daily vitamin supplementation   *(1) complete multivitamin with iron and (3) doses of 500 mg calcium citrate  * Make sure to separate the multivitamin and calcium by 2 hours so iron/calcium don't bind together and leave your body  * Make sure to take your calcium in 3 separate doses because your body cannot absorb more than 500 mg at a time

## 2012-08-11 ENCOUNTER — Encounter: Payer: Self-pay | Admitting: *Deleted

## 2012-09-11 ENCOUNTER — Ambulatory Visit: Payer: BC Managed Care – PPO | Admitting: *Deleted

## 2012-10-16 ENCOUNTER — Ambulatory Visit (INDEPENDENT_AMBULATORY_CARE_PROVIDER_SITE_OTHER): Payer: BC Managed Care – PPO | Admitting: Surgery

## 2012-10-16 ENCOUNTER — Encounter (INDEPENDENT_AMBULATORY_CARE_PROVIDER_SITE_OTHER): Payer: Self-pay | Admitting: Surgery

## 2012-10-16 VITALS — BP 142/84 | HR 81 | Temp 97.8°F | Resp 18 | Ht 72.0 in | Wt 355.4 lb

## 2012-10-16 DIAGNOSIS — Z9884 Bariatric surgery status: Secondary | ICD-10-CM

## 2012-10-16 NOTE — Patient Instructions (Signed)

## 2012-10-16 NOTE — Progress Notes (Signed)
Chief Complaint:  Difficulty in accessing lapband port  History of Present Illness:  Todd Delgado is an 52 y.o. male had APL inserted in 2009 and port was implanted on the fascia.  Because of his size this has lead to him having difficult access and infrequent lapband fills.  He would do better with subcutaneous placement.  I have discussed this with him and he is interested in having it relocated.  I discussed the procedure with him in some detail.   He has seen [facility] Dec and has had some weight loss success since his last visit.    Past Medical History  Diagnosis Date  . Diabetes mellitus without complication   . Hypertension   . CHF (congestive heart failure)   . Morbid obesity   . Sleep apnea     Past Surgical History  Procedure Laterality Date  . Laparoscopic gastric banding  July 2009     Dr. Grier Mitts    Current Outpatient Prescriptions  Medication Sig Dispense Refill  . aspirin 81 MG tablet Take 81 mg by mouth daily.      Marland Kitchen b complex vitamins tablet Take 1 tablet by mouth daily.      . cloNIDine (CATAPRES) 0.1 MG tablet Take 0.1 mg by mouth at bedtime.      Marland Kitchen CRANBERRY PO Take by mouth.      . diltiazem (CARDIZEM CD) 240 MG 24 hr capsule Take 240 mg by mouth at bedtime.       . insulin detemir (LEVEMIR) 100 UNIT/ML injection Inject 31 Units into the skin at bedtime.       . insulin NPH (HUMULIN N,NOVOLIN N) 100 UNIT/ML injection Inject 60 Units into the skin daily before breakfast. Per sliding scale      . insulin regular (NOVOLIN R,HUMULIN R) 100 units/mL injection Inject 10 Units into the skin 3 (three) times daily before meals. Per sliding scale      . labetalol (NORMODYNE) 300 MG tablet Take 600 mg by mouth 2 (two) times daily.       . metFORMIN (GLUMETZA) 500 MG (MOD) 24 hr tablet Take 500 mg by mouth daily with breakfast.      . Multiple Vitamins-Minerals (MULTIVITAMIN WITH MINERALS) tablet Take 1 tablet by mouth daily.      . Probiotic Product (PROBIOTIC DAILY PO) Take by  mouth daily.      . simvastatin (ZOCOR) 40 MG tablet Take 40 mg by mouth every evening.      Marland Kitchen UNABLE TO FIND Take by mouth daily. Med Name: MEGA RED  Take as directed.      . valsartan-hydrochlorothiazide (DIOVAN-HCT) 320-25 MG per tablet Take 1 tablet by mouth every morning.       . vitamin C (ASCORBIC ACID) 500 MG tablet Take 500 mg by mouth daily.       No current facility-administered medications for this visit.   Review of patient's allergies indicates no known allergies. Family History  Problem Relation Age of Onset  . Heart disease Father   . Diabetes Father    Social History:   reports that he has never smoked. He has never used smokeless tobacco. He reports that  drinks alcohol. He reports that he does not use illicit drugs.   REVIEW OF SYSTEMS - PERTINENT POSITIVES ONLY: noncontributory  Physical Exam:   Blood pressure 142/84, pulse 81, temperature 97.8 F (36.6 C), temperature source Temporal, resp. rate 18, height 6' (1.829 m), weight 355 lb 6.4 oz (161.208 kg).  Body mass index is 48.19 kg/(m^2).  Gen:  WDWN AAM NAD  Neurological: Alert and oriented to person, place, and time. Motor and sensory function is grossly intact  Head: Normocephalic and atraumatic.  Eyes: Conjunctivae are normal. Pupils are equal, round, and reactive to light. No scleral icterus.  Neck: Normal range of motion. Neck supple. No tracheal deviation or thyromegaly present.  Cardiovascular:  SR without murmurs or gallops.  No carotid bruits Respiratory: Effort normal.  No respiratory distress. No chest wall tenderness. Breath sounds normal.  No wheezes, rales or rhonchi.  Abdomen:  Obese, the port is vaguely felt on the right side and the patient is reluctant for me to try to access.   GU: Musculoskeletal: Normal range of motion. Extremities are nontender. No cyanosis, edema or clubbing noted Lymphadenopathy: No cervical, preauricular, postauricular or axillary adenopathy is present Skin: Skin is  warm and dry. No rash noted. No diaphoresis. No erythema. No pallor. Pscyh: Normal mood and affect. Behavior is normal. Judgment and thought content normal.   LABORATORY RESULTS: No results found for this or any previous visit (from the past 48 hour(s)).  RADIOLOGY RESULTS: No results found.  Problem List: Patient Active Problem List   Diagnosis Date Noted  . Lapband APL July 2009 07/15/2012    Assessment & Plan: Obscure and remote lapband port that is difficult to access.  Needs to be relocated to the subcutanous position.      Matt B. Daphine Deutscher, MD, [facility] [phone] beeper [phone]  10/16/2012 10:56 AM

## 2012-11-18 ENCOUNTER — Telehealth (INDEPENDENT_AMBULATORY_CARE_PROVIDER_SITE_OTHER): Payer: Self-pay | Admitting: Surgery

## 2012-11-18 NOTE — Telephone Encounter (Signed)
Patient will call back to schedule surgery.

## 2012-11-22 ENCOUNTER — Telehealth (INDEPENDENT_AMBULATORY_CARE_PROVIDER_SITE_OTHER): Payer: Self-pay | Admitting: Surgery

## 2012-11-22 NOTE — Telephone Encounter (Signed)
Mr Bacus states knows he is approved for revision port thru 01/14/13 but doubts will have by then.  Not interested at this time

## 2014-08-17 ENCOUNTER — Encounter: Payer: Self-pay | Admitting: Cardiovascular Disease

## 2014-08-18 ENCOUNTER — Telehealth: Payer: Self-pay | Admitting: Cardiology

## 2014-08-18 NOTE — Telephone Encounter (Signed)
Received records from [facility] for appointment with Todd BoozerLaura Ingold, NP on 09/10/14.  Records given to [employer] (medical records) for Laura's schedule on 09/10/14. lp

## 2014-08-18 NOTE — Telephone Encounter (Signed)
Received records from [facility] for appointment with Laura Ingold, NP on 09/10/14.  Records given to N Hines (medical records) for Laura's schedule on 09/10/14. lp °

## 2014-09-03 ENCOUNTER — Encounter: Payer: Self-pay | Admitting: Neurology

## 2014-09-03 ENCOUNTER — Ambulatory Visit (INDEPENDENT_AMBULATORY_CARE_PROVIDER_SITE_OTHER): Payer: 59 | Admitting: Neurology

## 2014-09-03 VITALS — BP 154/93 | HR 66 | Resp 18

## 2014-09-03 DIAGNOSIS — R030 Elevated blood-pressure reading, without diagnosis of hypertension: Secondary | ICD-10-CM

## 2014-09-03 DIAGNOSIS — G4733 Obstructive sleep apnea (adult) (pediatric): Secondary | ICD-10-CM | POA: Diagnosis not present

## 2014-09-03 DIAGNOSIS — R351 Nocturia: Secondary | ICD-10-CM | POA: Diagnosis not present

## 2014-09-03 DIAGNOSIS — IMO0001 Reserved for inherently not codable concepts without codable children: Secondary | ICD-10-CM

## 2014-09-03 NOTE — Patient Instructions (Signed)
You have a history of obstructive sleep apnea or OSA, and I think we should proceed with a sleep study to determine how severe it is. If you have more than mild OSA, I want you to consider treatment with CPAP or BiPAP. Please remember, the risks and ramifications of moderate to severe obstructive sleep apnea or OSA are: Cardiovascular disease, including congestive heart failure, stroke, difficult to control hypertension, arrhythmias, and even type 2 diabetes has been linked to untreated OSA. Sleep apnea causes disruption of sleep and sleep deprivation in most cases, which, in turn, can cause recurrent headaches, problems with memory, mood, concentration, focus, and vigilance. Most people with untreated sleep apnea report excessive daytime sleepiness, which can affect their ability to drive. Please do not drive if you feel sleepy.  I will see you back after your sleep study to go over the test results and where to go from there. We will call you after your sleep study and to set up an appointment at the time.

## 2014-09-03 NOTE — Progress Notes (Signed)
Subjective:    Patient ID: Todd Delgado is a 54 y.o. male.  HPI     Todd Foley, MD, PhD [facility] [address], [address] [address] [place], [place] [address]  Dear Dr. Allyne Gee,   I saw your patient, Todd Delgado, upon your kind request in my neurologic clinic today for initial consultation of his sleep disorder, in particular, concern for underlying obstructive sleep apnea. The patient is unaccompanied today. As you know, Todd Delgado is a very friendly 54 year old right-handed gentleman with an underlying medical history of hyperlipidemia, hypertension, cardiomyopathy, chronic kidney disease stage III, morbid obesity, status post lap band surgery in 2009 (lost net about 75 lb), and type 2 diabetes, who was previously diagnosed with obstructive sleep apnea and has not had a reevaluation in years. He has an old BiPAP machine, and while he brought the machine he did not bring the power cord. We do not have a recent compliance data download on him. We called his DME company, [facility]. He started off with [employer]. Prior sleep test results are not available for my review. He recalls that his sleep apnea was severe. He had a sleep study some 12 or 13 years ago. He uses a full face mask. His machine has not been working optimally at this point, making noises and shutting off randomly, but he cannot sleep without it at this time and he is faithful with usage. With using his BiPAP machine he denies any significant problems with morning headaches but does have nocturia occasionally. He used to be extremely sleepy during the day when he was.on treatment but this improved with treatment. His Epworth sleepiness score today is 6 out of 24. He drinks about 2 cups of coffee or tea per day. He drinks an occasional soda. He drinks wine with dinner sometimes about 3 times a week. He does not smoke. 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 His bedtime is  late between 11 PM and midnight. He falls asleep fairly well. His rise time is between 5:30 and 6 typically. He denies any significant restless leg symptoms. He sleeps on his sides. He denies any significant parasomnias but has noted rare episodes of sleep paralysis when he fell asleep without his machine. He suspects that there is a sleep apnea history in the family. No one else has been officially tested however. He has had some increases in blood pressure values. He is going to see cardiology soon. I reviewed his blood work from 07/29/2014 67-year-old office: PSA was normal, TSH was normal, hemoglobin A1c was elevated at 11, total cholesterol at 182 and LDL at 108, CMP normal, CBC with differential normal. Also reviewed your office note from 07/29/2014 which you kindly included.   His Past Medical History Is Significant For: Past Medical History  Diagnosis Date  . Diabetes mellitus without complication   . Hypertension   . CHF (congestive heart failure)   . Morbid obesity   . Sleep apnea   . Hyperlipemia   . Chronic kidney disease   . Post-herpetic polyneuropathy   . Impotence   . Sleep apnea   . Vitamin D deficiency   . Chest pain     His Past Surgical History Is Significant For: Past Surgical History  Procedure Laterality Date  . Laparoscopic gastric banding  July 2009     Dr. Grier Mitts    His Family History Is Significant For: Family History  Problem Relation Age of  Onset  . Heart disease Father   . Diabetes Father     His Social History Is Significant For: History   Social History  . Marital Status: Married    Spouse Name: N/A  . Number of Children: 2  . Years of Education: 2BS Degree   Occupational History  . [employer]     Social History Main Topics  . Smoking status: Never Smoker   . Smokeless tobacco: Never Used  . Alcohol Use: No  . Drug Use: No  . Sexual Activity: Not on file   Other Topics Concern  . None   Social History Narrative   2 cup of coffee/tea  a day     His Allergies Are:  No Known Allergies:   His Current Medications Are:  Outpatient Encounter Prescriptions as of 09/03/2014  Medication Sig  . aspirin 81 MG tablet Take 81 mg by mouth daily.  Marland Kitchen b complex vitamins tablet Take 1 tablet by mouth daily.  Marland Kitchen BYSTOLIC 10 MG tablet   . cloNIDine (CATAPRES - DOSED IN MG/24 HR) 0.3 mg/24hr patch   . cloNIDine (CATAPRES) 0.1 MG tablet Take 0.1 mg by mouth at bedtime.  Marland Kitchen CRANBERRY PO Take by mouth.  . diltiazem (CARDIZEM CD) 240 MG 24 hr capsule Take 240 mg by mouth at bedtime.   Marland Kitchen HUMALOG KWIKPEN 100 UNIT/ML KiwkPen   . insulin detemir (LEVEMIR) 100 UNIT/ML injection Inject 31 Units into the skin at bedtime.   . Multiple Vitamins-Minerals (MULTIVITAMIN WITH MINERALS) tablet Take 1 tablet by mouth daily.  . Probiotic Product (PROBIOTIC DAILY PO) Take by mouth daily.  . sildenafil (VIAGRA) 50 MG tablet Take 50 mg by mouth daily as needed for erectile dysfunction.  . simvastatin (ZOCOR) 40 MG tablet Take 40 mg by mouth every evening.  Marland Kitchen UNABLE TO FIND Take by mouth daily. Med Name: MEGA RED  Take as directed.  . valsartan-hydrochlorothiazide (DIOVAN-HCT) 320-25 MG per tablet Take 1 tablet by mouth every morning.   . vitamin C (ASCORBIC ACID) 500 MG tablet Take 500 mg by mouth daily.  . [DISCONTINUED] insulin NPH (HUMULIN N,NOVOLIN N) 100 UNIT/ML injection Inject 60 Units into the skin daily before breakfast. Per sliding scale  . [DISCONTINUED] insulin regular (NOVOLIN R,HUMULIN R) 100 units/mL injection Inject 10 Units into the skin 3 (three) times daily before meals. Per sliding scale  . [DISCONTINUED] labetalol (NORMODYNE) 300 MG tablet Take 600 mg by mouth 2 (two) times daily.   . [DISCONTINUED] metFORMIN (GLUMETZA) 500 MG (MOD) 24 hr tablet Take 500 mg by mouth daily with breakfast.  :  Review of Systems:  Out of a complete 14 point review of systems, all are reviewed and negative with the exception of these symptoms as listed  below:   Review of Systems  Musculoskeletal:       Occasional Pulsating sensation in L arm   Neurological:       Patient would like to get new BiPAP machine     Objective:  Neurologic Exam  Physical Exam Physical Examination:   Filed Vitals:   09/03/14 0837  BP: 154/93  Pulse: 66  Resp: 18    General Examination: The patient is a very pleasant 54 y.o. male in no acute distress. He appears well-developed and well-nourished and very well groomed.   HEENT: Normocephalic, atraumatic, pupils are equal, round and reactive to light and accommodation. Funduscopic exam is normal with sharp disc margins noted. Extraocular tracking is good without limitation to gaze excursion  or nystagmus noted. Normal smooth pursuit is noted. Hearing is grossly intact. Tympanic membranes are clear bilaterally. Face is symmetric with normal facial animation and normal facial sensation. Speech is clear with no dysarthria noted. There is no hypophonia. There is no lip, neck/head, jaw or voice tremor. Neck is supple with full range of passive and active motion. There are no carotid bruits on auscultation. Oropharynx exam reveals: mild mouth dryness, good dental hygiene and moderate airway crowding, due to narrow airway entry, larger uvula and large tongue. Mallampati is class II. Tongue protrudes centrally and palate elevates symmetrically. Tonsils are small. Neck size is 18 7/8 inches. He has a Mild overbite. Nasal inspection reveals no significant nasal mucosal bogginess or redness and no septal deviation.   Chest: Clear to auscultation without wheezing, rhonchi or crackles noted.  Heart: S1+S2+0, regular and normal without murmurs, rubs or gallops noted.   Abdomen: Soft, non-tender and non-distended with normal bowel sounds appreciated on auscultation.  Extremities: There is trace pitting edema in the distal lower extremities bilaterally. Pedal pulses are intact.  Skin: Warm and dry without trophic changes  noted. There are no varicose veins.  Musculoskeletal: exam reveals no obvious joint deformities, tenderness or joint swelling or erythema.   Neurologically:  Mental status: The patient is awake, alert and oriented in all 4 spheres. His immediate and remote memory, attention, language skills and fund of knowledge are appropriate. There is no evidence of aphasia, agnosia, apraxia or anomia. Speech is clear with normal prosody and enunciation. Thought process is linear. Mood is normal and affect is normal.  Cranial nerves II - XII are as described above under HEENT exam. In addition: shoulder shrug is normal with equal shoulder height noted. Motor exam: Normal bulk, strength and tone is noted. There is no drift, tremor or rebound. Romberg is negative. Reflexes are 2+ throughout. Babinski: Toes are flexor bilaterally. Fine motor skills and coordination: intact with normal finger taps, normal hand movements, normal rapid alternating patting, normal foot taps and normal foot agility.  Cerebellar testing: No dysmetria or intention tremor on finger to nose testing. Heel to shin is unremarkable bilaterally. There is no truncal or gait ataxia.  Sensory exam: intact to light touch, pinprick, vibration, temperature sense in the upper and lower extremities.  Gait, station and balance: He stands easily. No veering to one side is noted. No leaning to one side is noted. Posture is age-appropriate and stance is narrow based. Gait shows normal stride length and normal pace. No problems turning are noted. He turns en bloc. Tandem walk is unremarkable.  Assessment and Plan:   In summary, Beverely PaceJoseph Daleo is a very pleasant 54 y.o.-year old male with an underlying medical history of hyperlipidemia, hypertension, cardiomyopathy, chronic kidney disease stage III, morbid obesity, status post lap band surgery in 2009, and type 2 diabetes, who was previously diagnosed with severe obstructive sleep apnea and has not had a  reevaluation in years, probably in 5912 or 13 years. He has an old BiPAP machine. He was probably placed on BiPAP because of the pressure requirement. He has a history of morbid obesity and is status post lap band surgery in 2009 after which she lost about 90 pounds but regained some. Net weight loss is around 75 pounds he estimates. He knows he needs to work on weight loss. His blood sugar numbers and blood pressure values have been suboptimal. His physical exam is otherwise unremarkable with a normal neuro exam. There is no evidence of diabetic  neuropathy at this time which is reassuring. I had a long chat with the patient about my findings and the diagnosis of OSA, its prognosis and treatment options. We talked about medical treatments, surgical interventions and non-pharmacological approaches. I explained in particular the risks and ramifications of untreated moderate to severe OSA, especially with respect to developing cardiovascular disease down the Road, including congestive heart failure, difficult to treat hypertension, cardiac arrhythmias, or stroke. Even type 2 diabetes has, in part, been linked to untreated OSA. Symptoms of untreated OSA include daytime sleepiness, memory problems, mood irritability and mood disorder such as depression and anxiety, lack of energy, as well as recurrent headaches, especially morning headaches. We talked about trying to maintain a healthy lifestyle in general, as well as the importance of weight control. I encouraged the patient to eat healthy, exercise daily and keep well hydrated, to keep a scheduled bedtime and wake time routine, to not skip any meals and eat healthy snacks in between meals. I advised the patient not to drive when feeling sleepy. I recommended the following at this time: sleep study with potential positive airway pressure titration. (We will score hypopneas at 4% and split the sleep study into diagnostic and treatment portion, if the estimated. 2 hour  AHI is >20/h).   I explained the sleep test procedure to the patient and also outlined possible surgical and non-surgical treatment options of OSA, including the use of a custom-made dental device (which would require a referral to a specialist dentist or oral surgeon), upper airway surgical options, such as pillar implants, radiofrequency surgery, tongue base surgery, and UPPP (which would involve a referral to an ENT surgeon). Rarely, jaw surgery such as mandibular advancement may be considered.  I also explained the CPAP treatment option to the patient, who indicated that he is very motivated to continue with positive airway treatment. He definitely needs an updated machine as it is also making abnormal noises and sometimes distracts off and random. I explained the importance of being compliant with PAP treatment, not only for insurance purposes but primarily to improve His symptoms, and for the patient's long term health benefit, including to reduce His cardiovascular risks. I answered all his questions today and the patient was in agreement. I would like to see him back after the sleep study is completed and encouraged him to call with any interim questions, concerns, problems or updates.   Thank you very much for allowing me to participate in the care of this nice patient. If I can be of any further assistance to you please do not hesitate to call me at [phone].  Sincerely,   Todd Foley, MD, PhD

## 2014-09-08 ENCOUNTER — Encounter: Payer: Self-pay | Admitting: *Deleted

## 2014-09-10 ENCOUNTER — Encounter: Payer: Self-pay | Admitting: Cardiology

## 2014-09-10 ENCOUNTER — Ambulatory Visit (INDEPENDENT_AMBULATORY_CARE_PROVIDER_SITE_OTHER): Payer: 59 | Admitting: Cardiology

## 2014-09-10 VITALS — BP 170/110 | HR 79 | Ht 72.0 in | Wt 349.2 lb

## 2014-09-10 DIAGNOSIS — I517 Cardiomegaly: Secondary | ICD-10-CM | POA: Diagnosis not present

## 2014-09-10 DIAGNOSIS — I10 Essential (primary) hypertension: Secondary | ICD-10-CM | POA: Diagnosis not present

## 2014-09-10 NOTE — Patient Instructions (Signed)
Your physician has requested that you have an echocardiogram. Echocardiography is a painless test that uses sound waves to create images of your heart. It provides your doctor with information about the size and shape of your heart and how well your heart's chambers and valves are working. This procedure takes approximately one hour. There are no restrictions for this procedure.   Your physician recommends that you schedule a follow-up appointment in: 3-4 MONTHS with Dr. Tresa EndoKelly.

## 2014-09-10 NOTE — Progress Notes (Signed)
Cardiology Office Note   Date:  09/10/2014   ID:  Todd PaceJoseph Isakson, DOB 12/01/1960, MRN [id number]  PCP:  Alva GarnetSHELTON,KIMBERLY R., MD  Cardiologist:  Dr. Tresa EndoKelly    Chief Complaint  Patient presents with  . ABNORMAL EKG    Patient reports no chest pain.      History of Present Illness: Todd Delgado is a 54 y.o. male who presents for eval of EKG and his LVH.  Patient has not seen Dr. Tresa EndoKelly since 2014. He has a history of morbid obesity hypertension and severe obstructive apnea which he uses BiPAP daily. He also is type II diabetic. On his last echo he had severe LVH with at least grade 1 diastolic dysfunction. Dr. Nicholaus BloomKelley is managed his blood pressure medications in the past. He has had renal artery Dopplers in March 2013 with normal patency in his kidneys were normal. His other history does include gastric bypass surgery for morbid obesity a LAP-BAND procedure he lost weight initially but has gained some back at this point.  Patient also had a nuclear stress test 2006 that was considered low risk.  Patient's primary care provider is no longer in her previous roll.  So he has adjusted primary care physician from Dr. Renae GlossShelton to Dr. Allyne GeeSanders.  Today he denies any chest pain, no shortness of breath except with extreme exertion. He does exercise 4-5 times a week. He does better at stationary bike or biking itself as well as swimming because his arthritis from his weight is causing him some activity problems. He was seen by neurology for possible repeat sleep study but is to be to expensive for the patient so he'll continue to use his BiPAP. His diabetes is been improving lately now placed back on a better regimen.  He continues with erectile dysfunction and uses Levitra or Viagra.  Recent labs reveal cholesterol total 182 HDL 54 LDL 108 and triglycerides 409101 this was in March 2016. He is no longer on statin but his numbers are stable with no known coronary disease.    Past Medical History  Diagnosis  Date  . Diabetes mellitus without complication   . Hypertension   . CHF (congestive heart failure)   . Morbid obesity   . Sleep apnea   . Hyperlipemia   . Chronic kidney disease   . Post-herpetic polyneuropathy   . Impotence   . Sleep apnea   . Vitamin D deficiency   . Chest pain     Past Surgical History  Procedure Laterality Date  . Laparoscopic gastric banding  July 2009     Dr. Grier MittsK Earle     Current Outpatient Prescriptions  Medication Sig Dispense Refill  . b complex vitamins tablet Take 1 tablet by mouth daily.    Marland Kitchen. BYSTOLIC 10 MG tablet   2  . cloNIDine (CATAPRES - DOSED IN MG/24 HR) 0.3 mg/24hr patch   3  . CRANBERRY PO Take by mouth.    . diltiazem (CARDIZEM CD) 240 MG 24 hr capsule Take 240 mg by mouth at bedtime.     Marland Kitchen. HUMALOG KWIKPEN 100 UNIT/ML KiwkPen     . insulin detemir (LEVEMIR) 100 UNIT/ML injection Inject 31 Units into the skin at bedtime.     . Multiple Vitamins-Minerals (MULTIVITAMIN WITH MINERALS) tablet Take 1 tablet by mouth daily.    . Probiotic Product (PROBIOTIC DAILY PO) Take by mouth daily.    . sildenafil (VIAGRA) 50 MG tablet Take 50 mg by mouth daily as needed for  erectile dysfunction.    . simvastatin (ZOCOR) 40 MG tablet Take 40 mg by mouth every evening.    Marland Kitchen UNABLE TO FIND Take by mouth daily. Med Name: MEGA RED  Take as directed.    . valsartan-hydrochlorothiazide (DIOVAN-HCT) 320-25 MG per tablet Take 1 tablet by mouth every morning.     . vitamin C (ASCORBIC ACID) 500 MG tablet Take 500 mg by mouth daily.     No current facility-administered medications for this visit.    Allergies:   Review of patient's allergies indicates no known allergies.    Social History:  The patient  reports that he has never smoked. He has never used smokeless tobacco. He reports that he does not drink alcohol or use illicit drugs.   Family History:  The patient's family history includes Diabetes in his father; Heart disease in his father and another  family member; Heart failure in his father; Hypertension in his father; Myasthenia gravis in his mother.    ROS:  General:no colds or fevers, some decrease in weight Skin:no rashes or ulcers HEENT:no blurred vision, no congestion CV:see HPI PUL:see HPI GI:no diarrhea constipation or melena, no indigestion GU:no hematuria, no dysuria MS:no joint pain, no claudication Neuro:no syncope, no lightheadedness Endo:+ diabetes improving , no thyroid disease  Wt Readings from Last 3 Encounters:  09/10/14 349 lb 3.2 oz (158.396 kg)  10/16/12 355 lb 6.4 oz (161.208 kg)  08/07/12 359 lb 8 oz (163.068 kg)     PHYSICAL EXAM: VS:  BP 170/110 mmHg  Pulse 79  Ht 6' (1.829 m)  Wt 349 lb 3.2 oz (158.396 kg)  BMI 47.35 kg/m2 , BMI Body mass index is 47.35 kg/(m^2). Recheck of BP 150/84    General:Pleasant affect, NAD Skin:Warm and dry, brisk capillary refill HEENT:normocephalic, sclera clear, mucus membranes moist Neck:supple, no JVD, no bruits  Heart:S1S2 RRR muffled heart sounds with thick chest  without murmur, gallup, rub or click Lungs:clear without rales, rhonchi, or wheezes though muffled again UJW:JXBJY,NWGN, non tender, + BS, do not palpate liver spleen or masses Ext:no to tr at ankles lower ext edema, 2+ pedal pulses, 2+ radial pulses Neuro:alert and oriented X 3, MAE, follows commands, + facial symmetry    EKG:  EKG is ordered today. The ekg ordered today demonstrates SR with LVH, no acute changes from 07/2012, 1st degree AV block. PR 200 ms   Recent Labs: No results found for requested labs within last 365 days.    Lipid Panel No results found for: CHOL, TRIG, HDL, CHOLHDL, VLDL, LDLCALC, LDLDIRECT     Other studies Reviewed: Additional studies/ records that were reviewed today include: labs from Dr. Mathews Robinsons office..   ASSESSMENT AND PLAN:  1.  HTN not sure it is very well controlled. We'll need to monitor at home which he is aware of he will call if greater than  140.  2.  Severe LVH  Will repeat echo to eval. And he wil follow up with Dr. Bishop Limbo in 3 months.   3.  OSA using BiPAP   4. DM-2 followed by PCP.   Current medicines are reviewed with the patient today.  The patient Has no concerns regarding medicines.  The following changes have been made:  See above Labs/ tests ordered today include:see above  Disposition:   FU:  see above  Signed, Leone Brand, NP  09/10/2014 3:10 PM    [facility] [address] [place], [place], [place]  [address]/ [address] [place], [place] Phone: [phone]; Fax: [phone]  [phone]

## 2014-09-16 ENCOUNTER — Ambulatory Visit (HOSPITAL_COMMUNITY): Admission: RE | Admit: 2014-09-16 | Payer: 59 | Source: Ambulatory Visit

## 2014-10-02 ENCOUNTER — Encounter: Payer: Self-pay | Admitting: Cardiovascular Disease

## 2014-10-21 ENCOUNTER — Ambulatory Visit (HOSPITAL_COMMUNITY)
Admission: RE | Admit: 2014-10-21 | Discharge: 2014-10-21 | Disposition: A | Discharge status: Home / Self Care | Payer: 59 | Source: Ambulatory Visit | Attending: Cardiovascular Disease | Admitting: Cardiovascular Disease

## 2014-10-21 DIAGNOSIS — E785 Hyperlipidemia, unspecified: Secondary | ICD-10-CM | POA: Insufficient documentation

## 2014-10-21 DIAGNOSIS — Z6841 Body Mass Index (BMI) 40.0 and over, adult: Secondary | ICD-10-CM | POA: Insufficient documentation

## 2014-10-21 DIAGNOSIS — I509 Heart failure, unspecified: Secondary | ICD-10-CM | POA: Diagnosis not present

## 2014-10-21 DIAGNOSIS — Z8249 Family history of ischemic heart disease and other diseases of the circulatory system: Secondary | ICD-10-CM | POA: Insufficient documentation

## 2014-10-21 DIAGNOSIS — I10 Essential (primary) hypertension: Secondary | ICD-10-CM | POA: Diagnosis not present

## 2014-10-21 DIAGNOSIS — G4733 Obstructive sleep apnea (adult) (pediatric): Secondary | ICD-10-CM | POA: Diagnosis not present

## 2014-10-21 DIAGNOSIS — N189 Chronic kidney disease, unspecified: Secondary | ICD-10-CM | POA: Diagnosis not present

## 2014-10-21 DIAGNOSIS — I129 Hypertensive chronic kidney disease with stage 1 through stage 4 chronic kidney disease, or unspecified chronic kidney disease: Secondary | ICD-10-CM | POA: Insufficient documentation

## 2014-10-21 DIAGNOSIS — E1122 Type 2 diabetes mellitus with diabetic chronic kidney disease: Secondary | ICD-10-CM | POA: Diagnosis not present

## 2014-10-21 DIAGNOSIS — I517 Cardiomegaly: Secondary | ICD-10-CM

## 2014-11-04 ENCOUNTER — Telehealth: Payer: Self-pay | Admitting: Cardiovascular Disease

## 2014-11-04 NOTE — Telephone Encounter (Signed)
Pt would like his echo results from 10-21-14 please. He needs these results so his primary doctor can see them today.

## 2014-11-04 NOTE — Telephone Encounter (Signed)
Returned call to patient no answer.LMTC. 

## 2014-11-10 NOTE — Telephone Encounter (Signed)
Patient has an appt 11-12-14 with dr Tresa Endokelly.

## 2014-11-12 ENCOUNTER — Ambulatory Visit (INDEPENDENT_AMBULATORY_CARE_PROVIDER_SITE_OTHER): Payer: 59 | Admitting: Cardiovascular Disease

## 2014-11-12 ENCOUNTER — Encounter: Payer: Self-pay | Admitting: Cardiovascular Disease

## 2014-11-12 VITALS — BP 172/110 | HR 65 | Ht 72.0 in | Wt 344.0 lb

## 2014-11-12 DIAGNOSIS — E785 Hyperlipidemia, unspecified: Secondary | ICD-10-CM

## 2014-11-12 DIAGNOSIS — E118 Type 2 diabetes mellitus with unspecified complications: Secondary | ICD-10-CM

## 2014-11-12 DIAGNOSIS — Z79899 Other long term (current) drug therapy: Secondary | ICD-10-CM

## 2014-11-12 DIAGNOSIS — I10 Essential (primary) hypertension: Secondary | ICD-10-CM | POA: Insufficient documentation

## 2014-11-12 DIAGNOSIS — E119 Type 2 diabetes mellitus without complications: Secondary | ICD-10-CM | POA: Insufficient documentation

## 2014-11-12 DIAGNOSIS — I5032 Chronic diastolic (congestive) heart failure: Secondary | ICD-10-CM

## 2014-11-12 DIAGNOSIS — G4730 Sleep apnea, unspecified: Secondary | ICD-10-CM

## 2014-11-12 MED ORDER — SPIRONOLACTONE 25 MG PO TABS: 12.5000 mg | ORAL_TABLET | Freq: Two times a day (BID) | ORAL | Status: DC

## 2014-11-12 MED ORDER — NEBIVOLOL HCL 20 MG PO TABS: 1.0000 | ORAL_TABLET | Freq: Every day | ORAL | Status: AC

## 2014-11-12 MED ORDER — HYDRALAZINE HCL 25 MG PO TABS: 25.0000 mg | ORAL_TABLET | Freq: Two times a day (BID) | ORAL | Status: DC

## 2014-11-12 NOTE — Progress Notes (Signed)
Patient ID: Todd Delgado, male   DOB: 12/03/1960, 54 y.o.   MRN: [id number]     HPI: Todd Delgado is a 54 y.o. male who presents to the office today for a  27 month follow up cardiology evaluation.  Todd Delgado has a history of super morbid obesity with a maximum weight 415 pounds.  He had under gone LAP-BAND gastric bypass surgery and had a maximum weight reduction of approximate 90 pounds but has gained some of this weight back, such that now he is still approximate 70 pounds. Weight reduction.  He has a history of hypertension, severe obstructive sleep apnea for which he has been on BiPAP therapy, and type 2 diabetes mellitus.  When I last saw him in March 2014.  He was only beta wall 600 mg twice a day, clonidine 0.2 mg at bedtime, Cartia XT 240 mg, Diovan HCT 320/12.5, and simvastatin 20 mg in addition to his diabetic regimen.  He states over the past several years.  His insurance changed and he was forced to change his medications.  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  He saw Cecilie Kicks in April 2016 at which time his blood pressure was 170/110.  It does not appear that she made any medication adjustments.  An echo Doppler study was done on 10/21/2014 which revealed a hyperdynamic LV function with an EF of 65-70%, but with severe concentric LVH.  He had grade 1 diastolic dysfunction.  It was felt that the severe LVH was most likely due to hypertension rather than infiltrative cardiomyopathy.  The patient presents now for evaluation to see me.  He denies any chest pain.  He continues to use BiPAP.  He tries to avoid sodium.  He denies PND or orthopnea.  Past Medical History  Diagnosis Date  . Diabetes mellitus without complication   . Hypertension   . CHF (congestive heart failure)   . Morbid obesity   . Sleep apnea   . Hyperlipemia   . Chronic kidney disease   . Post-herpetic polyneuropathy   . Impotence   . Sleep apnea   . Vitamin D  deficiency   . Chest pain     Past Surgical History  Procedure Laterality Date  . Laparoscopic gastric banding  July 2009     Dr. Effie Shy    No Known Allergies  Current Outpatient Prescriptions  Medication Sig Dispense Refill  . 5-Hydroxytryptophan (5-HTP) 100 MG CAPS Take by mouth.    Marland Kitchen b complex vitamins tablet Take 1 tablet by mouth daily.    Marland Kitchen BIOTIN PO Take 75 mg by mouth.    . cholecalciferol (VITAMIN D) 1000 UNITS tablet Take 2,000 Units by mouth daily.    Marland Kitchen CINNAMON PO Take 1,000 mg by mouth.    . cloNIDine (CATAPRES - DOSED IN MG/24 HR) 0.3 mg/24hr patch   3  . CRANBERRY PO Take by mouth.    . diltiazem (CARDIZEM CD) 240 MG 24 hr capsule Take 240 mg by mouth at bedtime.     Marland Kitchen HUMALOG KWIKPEN 100 UNIT/ML KiwkPen     . insulin detemir (LEVEMIR) 100 UNIT/ML injection Inject 31 Units into the skin at bedtime.     . Multiple Vitamins-Minerals (MULTIVITAMIN WITH MINERALS) tablet Take 1 tablet by mouth daily.    . Probiotic Product (PROBIOTIC DAILY PO) Take by mouth daily.    . sildenafil (VIAGRA) 50 MG tablet Take 50 mg by mouth daily as needed for erectile  dysfunction.    Marland Kitchen UNABLE TO FIND Take by mouth daily. Med Name: MEGA RED  Take as directed.    . valsartan-hydrochlorothiazide (DIOVAN-HCT) 320-25 MG per tablet Take 1 tablet by mouth every morning.     . vitamin C (ASCORBIC ACID) 500 MG tablet Take 500 mg by mouth daily.    . hydrALAZINE (APRESOLINE) 25 MG tablet Take 1 tablet (25 mg total) by mouth 2 (two) times daily. 60 tablet 6  . Nebivolol HCl (BYSTOLIC) 20 MG TABS Take 1 tablet (20 mg total) by mouth daily. 30 tablet 6  . spironolactone (ALDACTONE) 25 MG tablet Take 0.5 tablets (12.5 mg total) by mouth 2 (two) times daily. 30 tablet 6   No current facility-administered medications for this visit.    History   Social History  . Marital Status: Married    Spouse Name: N/A  . Number of Children: 2  . Years of Education: 2BS Degree   Occupational History  .  [employer]     Social History Main Topics  . Smoking status: Never Smoker   . Smokeless tobacco: Never Used  . Alcohol Use: No  . Drug Use: No  . Sexual Activity: Not on file   Other Topics Concern  . Not on file   Social History Narrative   2 cup of coffee/tea a day     Family History  Problem Relation Age of Onset  . Heart disease Father   . Diabetes Father   . Myasthenia gravis Mother   . Hypertension Father   . Heart failure Father   . Heart disease      ROS General: Negative; No fevers, chills, or night sweats HEENT: Negative; No changes in vision or hearing, sinus congestion, difficulty swallowing Pulmonary: Negative; No cough, wheezing, shortness of breath, hemoptysis Cardiovascular: See HPI: No chest pain, presyncope, syncope, palpatations GI: Negative; No nausea, vomiting, diarrhea, or abdominal pain GU: Negative; No dysuria, hematuria, or difficulty voiding Musculoskeletal: Negative; no myalgias, joint pain, or weakness Hematologic: Negative; no easy bruising, bleeding Endocrine: Negative; no heat/cold intolerance; no diabetes, Neuro: Negative; no changes in balance, headaches Skin: Negative; No rashes or skin lesions Psychiatric: Negative; No behavioral problems, depression Sleep: Negative; No snoring,  daytime sleepiness, hypersomnolence, bruxism, restless legs, hypnogognic hallucinations. Other comprehensive 14 point system review is negative   Physical Exam BP 172/110 mmHg  Pulse 65  Ht 6' (1.829 m)  Wt 344 lb (156.037 kg)  BMI 46.64 kg/m2  Repeat blood pressure by me 170/106  Wt Readings from Last 3 Encounters:  11/12/14 344 lb (156.037 kg)  09/10/14 349 lb 3.2 oz (158.396 kg)  10/16/12 355 lb 6.4 oz (161.208 kg)   General: Alert, oriented, no distress.  Skin: normal turgor, no rashes, warm and dry HEENT: Normocephalic, atraumatic. Pupils equal round and reactive to light; sclera anicteric; extraocular muscles intact, No lid lag; Nose without nasal  septal hypertrophy; Mouth/Parynx benign; Mallinpatti scale 3/4 Neck: No JVD, no carotid bruits; normal carotid upstroke Lungs: clear to ausculatation and percussion bilaterally; no wheezing or rales, normal inspiratory and expiratory effort Chest wall: without tenderness to palpitation Heart: PMI not displaced, RRR, s1 s2 normal, 1/6 systolic murmur, No diastolic murmur, no rubs, gallops, thrills, or heaves Abdomen: Marked central adiposity;soft, nontender; no hepatosplenomehaly, BS+; abdominal aorta nontender and not dilated by palpation. Back: no CVA tenderness Pulses: 2+  Musculoskeletal: full range of motion, normal strength, no joint deformities Extremities: Pulses 2+, no clubbing cyanosis or edema, Homan's sign negative  Neurologic: grossly nonfocal; Cranial nerves grossly wnl Psychologic: Normal mood and affect   ECG (independently read by me): Sinus rhythm at 65 bpm.  Intervals normal.  LABS:  BMP Latest Ref Rng 08/03/2012 12/03/2007 05/27/2007  Glucose 70 - 99 mg/dL 120(H) 128(H) 98  BUN 6 - 23 mg/dL _0 Creatinine 0.50 - 1.35 mg/dL 1.27 1.20 1.28  Sodium 135 - 145 mEq/L 141 138 139  Potassium 3.5 - 5.1 mEq/L 3.5 3.6 3.0(L)  Chloride 96 - 112 mEq/L 103 104 106  CO2 19 - 32 mEq/L _1 Calcium 8.4 - 10.5 mg/dL 9.4 9.3 8.6     Hepatic Function 12/03/2007  Total Protein 7.7  Albumin 3.6  AST 88(H)  ALT 112(H)  Alk Phosphatase 73  Total Bilirubin 0.7    CBC Latest Ref Rng 08/03/2012 12/10/2007 12/03/2007  WBC 4.0 - 10.5 K/uL 7.9 12.6(H) -  Hemoglobin 13.0 - 17.0 g/dL 14.1 12.6(L) 13.2  Hematocrit 39.0 - 52.0 % 41.0 37.8(L) 39.9  Platelets 150 - 400 K/uL 318 402(H) -   Lab Results  Component Value Date   MCV 81.7 08/03/2012   MCV 82.6 12/10/2007   MCV 80.6 05/27/2007    No results found for: TSH  BNP No results found for: BNP  ProBNP    Component Value Date/Time   PROBNP 47.4 08/03/2012 1939     Lipid Panel  No results found for: CHOL, TRIG,  HDL, CHOLHDL, VLDL, LDLCALC, LDLDIRECT   RADIOLOGY: No results found.    ASSESSMENT AND PLAN: [name] is a 54 year old African-American gentleman who is super morbid obesity.  His weight today is 3 and 44 pounds which is down from his maximum of 4:15.  He continues to exercise and rides proximal he 5-10 miles per week on a bicycle.  He does occasional weight training.  He has stage II hypertension today despite taking his valsartan HCT 320/25, Cardizem CD 240 mg, 0.3 male gram per 24-hour clonidine patch, and Bystolic 20 mg daily. In the past his blood pressure had stabilized when he was on a labetolol but apparently this was discontinued secondary to his insurance.  I am recommending the addition of hydralazine and will start this at 25 mg twice a day.  I am also adding spironolactone 12.5 mg daily.  A complete set of blood work will be obtained in 1-2 weeks with the medication adjustment.  I will also check a BMP to assess diastolic heart failure.  I will see him in 4-6 weeks for reevaluation and further recommendation.  Adjustments will be made at that time if necessary.  Time spent: 30 minutes   Troy Sine, MD, [facility]  11/12/2014 6:37 PM

## 2014-11-12 NOTE — Patient Instructions (Addendum)
Your physician has recommended you make the following change in your medication: start new prescriptions for spironalactone and hydralazine as directed on the bottles. These havs already been sent to your pharmacy.  Your physician recommends that you return for lab work fasting in 2 weeks.  Your physician recommends that you schedule a follow-up appointment in:4-6 weeks.

## 2014-12-23 ENCOUNTER — Ambulatory Visit: Payer: 59 | Admitting: Cardiovascular Disease

## 2014-12-23 ENCOUNTER — Telehealth: Payer: Self-pay | Admitting: Cardiovascular Disease

## 2014-12-23 NOTE — Telephone Encounter (Signed)
Reviewed medications that were started in June, hydralazine and spironolactone.  Uses CPAP, wonders about his machine if it needs cleaning.  Next OV with PCP in September  I told him that cough with Spirolactone and hydralazine were not a common occurrence but I would check with our pharmacist.  He has had instructions on how to clean his machine.  I advised him that it would be a good idea to contact the people that issue him the CPAP to make sure he doesn't have any maintenances or updates needed.  I told his PCP to see if he could be worked in a little earlier for his cough.

## 2014-12-23 NOTE — Telephone Encounter (Signed)
Patient has had a dry cough since he was here in June and was started on new meds.  He wants to know if medication could be causing it.

## 2014-12-24 LAB — CBC
HCT: 44.7 % (ref 39.0–52.0)
Hemoglobin: 14.9 g/dL (ref 13.0–17.0)
MCH: 27.3 pg (ref 26.0–34.0)
MCHC: 33.3 g/dL (ref 30.0–36.0)
MCV: 81.9 fL (ref 78.0–100.0)
MPV: 10.2 fL (ref 8.6–12.4)
PLATELETS: 395 10*3/uL (ref 150–400)
RBC: 5.46 MIL/uL (ref 4.22–5.81)
RDW: 14.2 % (ref 11.5–15.5)
WBC: 9 10*3/uL (ref 4.0–10.5)

## 2014-12-24 LAB — COMPREHENSIVE METABOLIC PANEL
ALBUMIN: 4.1 g/dL (ref 3.6–5.1)
ALK PHOS: 88 U/L (ref 40–115)
ALT: 25 U/L (ref 9–46)
AST: 21 U/L (ref 10–35)
BILIRUBIN TOTAL: 0.4 mg/dL (ref 0.2–1.2)
BUN: 17 mg/dL (ref 7–25)
CO2: 20 mmol/L (ref 20–31)
Calcium: 9.6 mg/dL (ref 8.6–10.3)
Chloride: 98 mmol/L (ref 98–110)
Creat: 1.28 mg/dL (ref 0.70–1.33)
Glucose, Bld: 269 mg/dL — ABNORMAL HIGH (ref 65–99)
POTASSIUM: 4.5 mmol/L (ref 3.5–5.3)
SODIUM: 135 mmol/L (ref 135–146)
TOTAL PROTEIN: 7.7 g/dL (ref 6.1–8.1)

## 2014-12-24 LAB — LIPID PANEL
CHOL/HDL RATIO: 4.9 ratio (ref <=5.0)
Cholesterol: 204 mg/dL — ABNORMAL HIGH (ref 125–200)
HDL: 42 mg/dL (ref >=40)
LDL CALC: 117 mg/dL (ref <130)
Triglycerides: 227 mg/dL — ABNORMAL HIGH (ref <150)
VLDL: 45 mg/dL — AB (ref <30)

## 2014-12-24 LAB — TSH: TSH: 1.812 u[IU]/mL (ref 0.350–4.500)

## 2014-12-24 LAB — BRAIN NATRIURETIC PEPTIDE: Brain Natriuretic Peptide: 13.8 pg/mL (ref 0.0–100.0)

## 2014-12-24 NOTE — Telephone Encounter (Signed)
Cough not a concern with his meds.  Agree with recommendation to see PCP

## 2014-12-29 ENCOUNTER — Encounter: Payer: 59 | Admitting: Cardiovascular Disease

## 2014-12-29 NOTE — Progress Notes (Signed)
This encounter was created in error - please disregard.

## 2015-01-04 ENCOUNTER — Telehealth: Payer: Self-pay | Admitting: *Deleted

## 2015-01-04 ENCOUNTER — Telehealth: Payer: Self-pay | Admitting: Cardiovascular Disease

## 2015-01-04 MED ORDER — DILTIAZEM HCL ER COATED BEADS 300 MG PO CP24: 300.0000 mg | ORAL_CAPSULE | Freq: Every day | ORAL | Status: DC

## 2015-01-04 NOTE — Telephone Encounter (Signed)
Called patient to inform him of lab results and recommendations. He states that he was not aware of having a appointment scheduled. Also he informed me the lab specimen was not a fasting specimen. Dr. Tresa Endo will be notified. Patient asked for a refill on his diltiazem xt 300 mg. I told him that our records show that he takes 240 mg. He went and retrieved the bottle and reports that it is diltiazem XT 300 mg.  Prescription refilled per patient request.

## 2015-01-04 NOTE — Telephone Encounter (Signed)
Closed encounter °

## 2015-01-04 NOTE — Telephone Encounter (Signed)
-----   Message from Lennette Bihari, MD sent at 01/04/2015  9:17 AM EDT ----- Did not show for appt.  Needs improved DM control;  Start atorvastatin 20 mg

## 2015-01-05 ENCOUNTER — Encounter: Payer: Self-pay | Admitting: *Deleted

## 2015-01-05 NOTE — Telephone Encounter (Signed)
acknowledged

## 2015-01-13 ENCOUNTER — Telehealth: Payer: Self-pay

## 2015-01-13 NOTE — Telephone Encounter (Signed)
FYI: I called patient originally about moving f/u appt to another time slot but patient asked me to cancel appt. You ordered a sleep study for the patient but he is unable to afford at this time. He will call back and schedule when his deductible is less.

## 2015-01-14 ENCOUNTER — Other Ambulatory Visit: Payer: Self-pay | Admitting: *Deleted

## 2015-01-14 DIAGNOSIS — E785 Hyperlipidemia, unspecified: Secondary | ICD-10-CM

## 2015-01-14 NOTE — Telephone Encounter (Signed)
Spoke with Dr. Tresa Endo about ordering the atorvastatin since the lab test was not fasting. He requests to hold  For now. Have patient to get the lab redrawn.

## 2015-01-20 ENCOUNTER — Ambulatory Visit: Payer: 59 | Admitting: Neurology

## 2015-03-09 ENCOUNTER — Ambulatory Visit: Payer: 59 | Admitting: Cardiovascular Disease

## 2015-04-15 ENCOUNTER — Ambulatory Visit (INDEPENDENT_AMBULATORY_CARE_PROVIDER_SITE_OTHER): Payer: 59 | Admitting: Cardiovascular Disease

## 2015-04-15 ENCOUNTER — Encounter: Payer: Self-pay | Admitting: Cardiovascular Disease

## 2015-04-15 ENCOUNTER — Ambulatory Visit: Payer: 59 | Admitting: Cardiovascular Disease

## 2015-04-15 VITALS — BP 136/86 | HR 65 | Ht 72.0 in | Wt 349.0 lb

## 2015-04-15 DIAGNOSIS — G4730 Sleep apnea, unspecified: Secondary | ICD-10-CM | POA: Diagnosis not present

## 2015-04-15 DIAGNOSIS — I10 Essential (primary) hypertension: Secondary | ICD-10-CM | POA: Diagnosis not present

## 2015-04-15 DIAGNOSIS — Z9884 Bariatric surgery status: Secondary | ICD-10-CM | POA: Diagnosis not present

## 2015-04-15 MED ORDER — ATORVASTATIN CALCIUM 20 MG PO TABS: 20.0000 mg | ORAL_TABLET | Freq: Every day | ORAL | Status: AC

## 2015-04-15 MED ORDER — TADALAFIL 20 MG PO TABS: 20.0000 mg | ORAL_TABLET | Freq: Every day | ORAL | Status: AC | PRN

## 2015-04-15 NOTE — Patient Instructions (Signed)
Your physician has recommended you make the following change in your medication: start new prescription for atorvastatin 20 mg. This has been sent to your pharmacy.  Your physician recommends that you return for lab work in: in 3 months from Dr Baird [facility]- [facility] and [place] wants you to follow-up in: 6 months or sooner if needed. You will receive a reminder letter in the mail two months in advance. If you don't receive a letter, please call our office to schedule the follow-up appointment.  If you need a refill on your cardiac medications before your next appointment, please call your pharmacy.

## 2015-04-15 NOTE — Progress Notes (Signed)
Patient ID: Todd Delgado, male   DOB: August 11, 1960, 54 y.o.   MRN: [id number]     Primary M.D.: Dr. Bryon Lions  HPI: Todd Delgado is a 54 y.o. male who presents to the office today for a 6 month follow up cardiology evaluation.  Todd Delgado has a history of super morbid obesity with a maximum weight 415 pounds.  He had under gone LAP-BAND gastric bypass surgery and had a maximum weight reduction of approximate 90 pounds but has gained some of this weight back.   He has a history of hypertension, severe obstructive sleep apnea for which he has been on BiPAP therapy, and type 2 diabetes mellitus.   Over the past several years his insurance changed and he was forced to change his medications.  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  He saw Todd Delgado in April 2016 at which time his blood pressure was 170/110.  It does not appear that she made any medication adjustments.  An echo Doppler study on 10/21/2014  revealed a hyperdynamic LV function with an EF of 65-70%, but with severe concentric LVH.  He had grade 1 diastolic dysfunction.  It was felt that the severe LVH was most likely due to hypertension rather than infiltrative cardiomyopathy.    Most recently, he has been on Valsartan HCT 320/25, spironolactone 12.5 mg twice a day, Bystolic 20 mg daily, hydralazine 25 mg twice a day, diltiazem CD 300 mg daily, and Catapres 0.3 mg /24-hour patch.   he had follow-up blood work in August 2016. Lipid studies were elevated with a total cluster 204, triglycerides 227, VLDL 45, and LDL 117.  He was supposed to start atorvastatin 20 mg but apparently had never done this.  The BMP was normal at 13.8.  He denies any chest pain.  He continues to use BiPAP.  He tries to avoid sodium.  He denies PND or orthopnea.  Recently, he has had increasing issues with erectile dysfunction. In the past he had been on Cialis intermittently.  Past Medical History  Diagnosis Date  .  Diabetes mellitus without complication ([place])   . Hypertension   . CHF (congestive heart failure) ([place])   . Morbid obesity ([place])   . Sleep apnea   . Hyperlipemia   . Chronic kidney disease   . Post-herpetic polyneuropathy   . Impotence   . Sleep apnea   . Vitamin D deficiency   . Chest pain     Past Surgical History  Procedure Laterality Date  . Laparoscopic gastric banding  July 2009     Dr. Effie Shy    No Known Allergies  Current Outpatient Prescriptions  Medication Sig Dispense Refill  . 5-Hydroxytryptophan (5-HTP) 100 MG CAPS Take by mouth.    Marland Kitchen b complex vitamins tablet Take 1 tablet by mouth daily.    Marland Kitchen BIOTIN PO Take 75 mg by mouth.    . cholecalciferol (VITAMIN D) 1000 UNITS tablet Take 2,000 Units by mouth daily.    Marland Kitchen CINNAMON PO Take 1,000 mg by mouth.    . cloNIDine (CATAPRES - DOSED IN MG/24 HR) 0.3 mg/24hr patch Place 0.3 mg onto the skin once a week.   3  . CRANBERRY PO Take by mouth.    . diltiazem (CARDIZEM CD) 300 MG 24 hr capsule Take 1 capsule (300 mg total) by mouth daily. 30 capsule 6  . HUMALOG KWIKPEN 100 UNIT/ML KiwkPen Inject into the skin 2 (two) times daily. Sliding  scale    . hydrALAZINE (APRESOLINE) 25 MG tablet Take 1 tablet (25 mg total) by mouth 2 (two) times daily. 60 tablet 6  . insulin detemir (LEVEMIR) 100 UNIT/ML injection Inject 36 Units into the skin at bedtime.     . Multiple Vitamins-Minerals (MULTIVITAMIN WITH MINERALS) tablet Take 1 tablet by mouth daily.    . Nebivolol HCl (BYSTOLIC) 20 MG TABS Take 1 tablet (20 mg total) by mouth daily. 30 tablet 6  . Probiotic Product (PROBIOTIC DAILY PO) Take by mouth daily.    . sildenafil (VIAGRA) 50 MG tablet Take 50 mg by mouth daily as needed for erectile dysfunction.    Marland Kitchen spironolactone (ALDACTONE) 25 MG tablet Take 0.5 tablets (12.5 mg total) by mouth 2 (two) times daily. 30 tablet 6  . tadalafil (CIALIS) 20 MG tablet Take 1 tablet (20 mg total) by mouth daily as needed for erectile  dysfunction. 10 tablet 2  . UNABLE TO FIND Take by mouth daily. Med Name: MEGA RED  Take as directed.    . valsartan-hydrochlorothiazide (DIOVAN-HCT) 320-25 MG per tablet Take 1 tablet by mouth every morning.     . vitamin C (ASCORBIC ACID) 500 MG tablet Take 500 mg by mouth daily.    Marland Kitchen atorvastatin (LIPITOR) 20 MG tablet Take 1 tablet (20 mg total) by mouth daily. 30 tablet 6   No current facility-administered medications for this visit.    Social History   Social History  . Marital Status: Married    Spouse Name: N/A  . Number of Children: 2  . Years of Education: 2BS Degree   Occupational History  . [employer]     Social History Main Topics  . Smoking status: Never Smoker   . Smokeless tobacco: Never Used  . Alcohol Use: No  . Drug Use: No  . Sexual Activity: Not on file   Other Topics Concern  . Not on file   Social History Narrative   2 cup of coffee/tea a day     Family History  Problem Relation Age of Onset  . Heart disease Father   . Diabetes Father   . Myasthenia gravis Mother   . Hypertension Father   . Heart failure Father   . Heart disease      ROS General: Negative; No fevers, chills, or night sweats HEENT: Negative; No changes in vision or hearing, sinus congestion, difficulty swallowing Pulmonary: Negative; No cough, wheezing, shortness of breath, hemoptysis Cardiovascular: See HPI: No chest pain, presyncope, syncope, palpatations GI: Negative; No nausea, vomiting, diarrhea, or abdominal pain GU: Negative; No dysuria, hematuria, or difficulty voiding Musculoskeletal: Negative; no myalgias, joint pain, or weakness Hematologic: Negative; no easy bruising, bleeding Endocrine: Negative; no heat/cold intolerance; no diabetes, Neuro: Negative; no changes in balance, headaches Skin: Negative; No rashes or skin lesions Psychiatric: Negative; No behavioral problems, depression Sleep: Negative; No snoring,  daytime sleepiness, hypersomnolence, bruxism,  restless legs, hypnogognic hallucinations. Other comprehensive 14 point system review is negative   Physical Exam BP 136/86 mmHg  Pulse 65  Ht 6' (1.829 m)  Wt 349 lb (158.305 kg)  BMI 47.32 kg/m2  Repeat blood pressure by me 140/80.  He states that his blood pressure at home typically is in the 120s over 80s.  Wt Readings from Last 3 Encounters:  04/15/15 349 lb (158.305 kg)  11/12/14 344 lb (156.037 kg)  09/10/14 349 lb 3.2 oz (158.396 kg)   General: Alert, oriented, no distress.  Skin: normal turgor, no rashes, warm  and dry HEENT: Normocephalic, atraumatic. Pupils equal round and reactive to light; sclera anicteric; extraocular muscles intact, No lid lag; Nose without nasal septal hypertrophy; Mouth/Parynx benign; Mallinpatti scale 3/4 Neck: No JVD, no carotid bruits; normal carotid upstroke Lungs: clear to ausculatation and percussion bilaterally; no wheezing or rales, normal inspiratory and expiratory effort Chest wall: without tenderness to palpitation Heart: PMI not displaced, RRR, s1 s2 normal, 1/6 systolic murmur, No diastolic murmur, no rubs, gallops, thrills, or heaves Abdomen: Marked central adiposity;soft, nontender; no hepatosplenomehaly, BS+; abdominal aorta nontender and not dilated by palpation. Back: no CVA tenderness Pulses: 2+  Musculoskeletal: full range of motion, normal strength, no joint deformities Extremities: Pulses 2+, no clubbing cyanosis or edema, Homan's sign negative  Neurologic: grossly nonfocal; Cranial nerves grossly wnl Psychologic: Normal mood and affect  ECG (independently read by me):  Normal sinus rhythm at 65.  Left anterior hemiblock.  June 2016 ECG (independently read by me): Sinus rhythm at 65 bpm.  Intervals normal.  LABS:  BMP Latest Ref Rng 12/23/2014 08/03/2012 12/03/2007  Glucose 65 - 99 mg/dL 269(H) 120(H) 128(H)  BUN 7 - 25 mg/dL '17 16 16  ' Creatinine 0.70 - 1.33 mg/dL 1.28 1.27 1.20  Sodium 135 - 146 mmol/L 135 141 138    Potassium 3.5 - 5.3 mmol/L 4.5 3.5 3.6  Chloride 98 - 110 mmol/L 98 103 104  CO2 20 - 31 mmol/L '20 26 24  ' Calcium 8.6 - 10.3 mg/dL 9.6 9.4 9.3     Hepatic Function Latest Ref Rng 12/23/2014 12/03/2007  Total Protein 6.1 - 8.1 g/dL 7.7 7.7  Albumin 3.6 - 5.1 g/dL 4.1 3.6  AST 10 - 35 U/L 21 88(H)  ALT 9 - 46 U/L 25 112(H)  Alk Phosphatase 40 - 115 U/L 88 73  Total Bilirubin 0.2 - 1.2 mg/dL 0.4 0.7    CBC Latest Ref Rng 12/23/2014 08/03/2012 12/10/2007  WBC 4.0 - 10.5 K/uL 9.0 7.9 12.6(H)  Hemoglobin 13.0 - 17.0 g/dL 14.9 14.1 12.6(L)  Hematocrit 39.0 - 52.0 % 44.7 41.0 37.8(L)  Platelets 150 - 400 K/uL 395 318 402(H)   Lab Results  Component Value Date   MCV 81.9 12/23/2014   MCV 81.7 08/03/2012   MCV 82.6 12/10/2007    Lab Results  Component Value Date   TSH 1.812 12/23/2014    BNP No results found for: BNP  ProBNP    Component Value Date/Time   PROBNP 47.4 08/03/2012 1939     Lipid Panel     Component Value Date/Time   CHOL 204* 12/23/2014 1656   TRIG 227* 12/23/2014 1656   HDL 42 12/23/2014 1656   CHOLHDL 4.9 12/23/2014 1656   VLDL 45* 12/23/2014 1656   LDLCALC 117 12/23/2014 1656     RADIOLOGY: No results found.    ASSESSMENT AND PLAN: Todd Delgado is a 54 year old African-American gentleman who hassuper morbid obesity.   His weight today is 5 pounds increased from  June 2016 pounds which is down from his maximum of 415.  His blood pressure today has significantly improved on his current regimen which was increased at his last office visit with the addition of hydralazine and spironolactone. I reviewed his recent laboratory.  I am starting him on atorvastatin 20 mg for hyperlipidemia. We again discussed weight loss and continued exercise program. Had progressive erectile dysfunction issues.  He requested that I provide him a prescription with sialoliths to see if his insurance will cover this.  If his insurance will  not, it may be possible to use  generics sindilafil from a cost perspective.  He will be seeing Dr. Bryon Lions.  I have requested that follow-up laboratory be obtained in several months with the initiation of atorvastatin. He has obstructive sleep apnea and continues to use his BiPAP therapy with 100% compliance. I will see him in 6 months for reevaluation.   Time spent: 25 minutes   Todd Sine, MD, [facility]  04/15/2015 7:57 PM

## 2015-06-21 ENCOUNTER — Ambulatory Visit: Payer: 59 | Admitting: Cardiovascular Disease

## 2016-02-18 ENCOUNTER — Encounter (HOSPITAL_COMMUNITY): Payer: Self-pay

## 2016-11-27 ENCOUNTER — Telehealth (HOSPITAL_COMMUNITY): Payer: Self-pay

## 2016-11-27 NOTE — Telephone Encounter (Signed)
Patient called the Bariatric & Wellness Dept today advising that he is attempting to transfer [facility]. [facility] so they can formally assist with the transfer of his lap band surgical records & long term follow up since July 2009.

## 2017-03-15 HISTORY — PX: BARIATRIC SURGERY: SHX1103

## 2017-03-16 DIAGNOSIS — Z01818 Encounter for other preprocedural examination: Secondary | ICD-10-CM | POA: Diagnosis not present

## 2017-03-16 DIAGNOSIS — E1169 Type 2 diabetes mellitus with other specified complication: Secondary | ICD-10-CM | POA: Diagnosis not present

## 2017-03-16 DIAGNOSIS — Z6841 Body Mass Index (BMI) 40.0 and over, adult: Secondary | ICD-10-CM | POA: Diagnosis not present

## 2017-03-16 DIAGNOSIS — Z9884 Bariatric surgery status: Secondary | ICD-10-CM | POA: Diagnosis not present

## 2017-03-16 DIAGNOSIS — I10 Essential (primary) hypertension: Secondary | ICD-10-CM | POA: Diagnosis not present

## 2017-03-16 DIAGNOSIS — E119 Type 2 diabetes mellitus without complications: Secondary | ICD-10-CM | POA: Diagnosis not present

## 2017-03-26 DIAGNOSIS — G4733 Obstructive sleep apnea (adult) (pediatric): Secondary | ICD-10-CM | POA: Diagnosis not present

## 2017-03-26 DIAGNOSIS — E119 Type 2 diabetes mellitus without complications: Secondary | ICD-10-CM | POA: Diagnosis not present

## 2017-03-26 DIAGNOSIS — Z6841 Body Mass Index (BMI) 40.0 and over, adult: Secondary | ICD-10-CM | POA: Diagnosis not present

## 2017-03-26 DIAGNOSIS — Z794 Long term (current) use of insulin: Secondary | ICD-10-CM | POA: Diagnosis not present

## 2017-03-26 DIAGNOSIS — Z79899 Other long term (current) drug therapy: Secondary | ICD-10-CM | POA: Diagnosis not present

## 2017-03-26 DIAGNOSIS — E1169 Type 2 diabetes mellitus with other specified complication: Secondary | ICD-10-CM | POA: Diagnosis not present

## 2017-03-26 DIAGNOSIS — Z9884 Bariatric surgery status: Secondary | ICD-10-CM | POA: Diagnosis not present

## 2017-03-26 DIAGNOSIS — E785 Hyperlipidemia, unspecified: Secondary | ICD-10-CM | POA: Diagnosis not present

## 2017-03-26 DIAGNOSIS — I10 Essential (primary) hypertension: Secondary | ICD-10-CM | POA: Diagnosis not present

## 2017-06-04 DIAGNOSIS — R197 Diarrhea, unspecified: Secondary | ICD-10-CM | POA: Diagnosis not present

## 2017-06-20 DIAGNOSIS — Z713 Dietary counseling and surveillance: Secondary | ICD-10-CM | POA: Diagnosis not present

## 2017-06-20 DIAGNOSIS — Z9884 Bariatric surgery status: Secondary | ICD-10-CM | POA: Diagnosis not present

## 2017-07-04 DIAGNOSIS — Z794 Long term (current) use of insulin: Secondary | ICD-10-CM | POA: Diagnosis not present

## 2017-07-04 DIAGNOSIS — E1159 Type 2 diabetes mellitus with other circulatory complications: Secondary | ICD-10-CM | POA: Diagnosis not present

## 2017-07-04 DIAGNOSIS — E1165 Type 2 diabetes mellitus with hyperglycemia: Secondary | ICD-10-CM | POA: Diagnosis not present

## 2017-07-04 DIAGNOSIS — Z9884 Bariatric surgery status: Secondary | ICD-10-CM | POA: Diagnosis not present

## 2017-07-27 DIAGNOSIS — E1165 Type 2 diabetes mellitus with hyperglycemia: Secondary | ICD-10-CM | POA: Diagnosis not present

## 2017-07-27 DIAGNOSIS — Z9884 Bariatric surgery status: Secondary | ICD-10-CM | POA: Diagnosis not present

## 2017-07-27 DIAGNOSIS — Z794 Long term (current) use of insulin: Secondary | ICD-10-CM | POA: Diagnosis not present

## 2017-08-06 DIAGNOSIS — K582 Mixed irritable bowel syndrome: Secondary | ICD-10-CM | POA: Diagnosis not present

## 2017-09-24 DIAGNOSIS — Z9884 Bariatric surgery status: Secondary | ICD-10-CM | POA: Diagnosis not present

## 2017-09-24 DIAGNOSIS — K912 Postsurgical malabsorption, not elsewhere classified: Secondary | ICD-10-CM | POA: Diagnosis not present

## 2017-10-16 DIAGNOSIS — I10 Essential (primary) hypertension: Secondary | ICD-10-CM | POA: Diagnosis not present

## 2017-10-16 DIAGNOSIS — K912 Postsurgical malabsorption, not elsewhere classified: Secondary | ICD-10-CM | POA: Diagnosis not present

## 2017-10-16 DIAGNOSIS — E119 Type 2 diabetes mellitus without complications: Secondary | ICD-10-CM | POA: Diagnosis not present

## 2017-10-16 DIAGNOSIS — Z9884 Bariatric surgery status: Secondary | ICD-10-CM | POA: Diagnosis not present

## 2017-12-19 DIAGNOSIS — W101XXA Fall (on)(from) sidewalk curb, initial encounter: Secondary | ICD-10-CM | POA: Diagnosis not present

## 2017-12-19 DIAGNOSIS — S2242XA Multiple fractures of ribs, left side, initial encounter for closed fracture: Secondary | ICD-10-CM | POA: Diagnosis not present

## 2017-12-19 DIAGNOSIS — M25511 Pain in right shoulder: Secondary | ICD-10-CM | POA: Diagnosis not present

## 2017-12-19 DIAGNOSIS — I10 Essential (primary) hypertension: Secondary | ICD-10-CM | POA: Diagnosis not present

## 2018-02-22 DIAGNOSIS — Z794 Long term (current) use of insulin: Secondary | ICD-10-CM | POA: Diagnosis not present

## 2018-02-22 DIAGNOSIS — Z6841 Body Mass Index (BMI) 40.0 and over, adult: Secondary | ICD-10-CM | POA: Diagnosis not present

## 2018-02-22 DIAGNOSIS — E1165 Type 2 diabetes mellitus with hyperglycemia: Secondary | ICD-10-CM | POA: Diagnosis not present

## 2018-02-22 DIAGNOSIS — Z23 Encounter for immunization: Secondary | ICD-10-CM | POA: Diagnosis not present

## 2018-02-22 DIAGNOSIS — Z9884 Bariatric surgery status: Secondary | ICD-10-CM | POA: Diagnosis not present

## 2018-02-22 DIAGNOSIS — I10 Essential (primary) hypertension: Secondary | ICD-10-CM | POA: Diagnosis not present

## 2018-02-22 DIAGNOSIS — E1159 Type 2 diabetes mellitus with other circulatory complications: Secondary | ICD-10-CM | POA: Diagnosis not present

## 2018-04-03 DIAGNOSIS — K912 Postsurgical malabsorption, not elsewhere classified: Secondary | ICD-10-CM | POA: Diagnosis not present

## 2018-04-03 DIAGNOSIS — Z9884 Bariatric surgery status: Secondary | ICD-10-CM | POA: Diagnosis not present

## 2018-04-18 DIAGNOSIS — K912 Postsurgical malabsorption, not elsewhere classified: Secondary | ICD-10-CM | POA: Diagnosis not present

## 2018-04-18 DIAGNOSIS — I10 Essential (primary) hypertension: Secondary | ICD-10-CM | POA: Diagnosis not present

## 2018-04-18 DIAGNOSIS — Z9884 Bariatric surgery status: Secondary | ICD-10-CM | POA: Diagnosis not present

## 2018-11-12 ENCOUNTER — Other Ambulatory Visit: Payer: Self-pay | Admitting: *Deleted

## 2018-11-12 DIAGNOSIS — Z20822 Contact with and (suspected) exposure to covid-19: Secondary | ICD-10-CM

## 2018-11-14 DIAGNOSIS — E119 Type 2 diabetes mellitus without complications: Secondary | ICD-10-CM | POA: Diagnosis not present

## 2018-11-19 LAB — NOVEL CORONAVIRUS, NAA: SARS-CoV-2, NAA: NOT DETECTED

## 2018-11-19 LAB — SPECIMEN STATUS REPORT

## 2018-12-03 ENCOUNTER — Telehealth: Payer: Self-pay | Admitting: Family Medicine

## 2018-12-03 NOTE — Telephone Encounter (Signed)
Patient called and received results °

## 2018-12-06 ENCOUNTER — Other Ambulatory Visit: Payer: Self-pay

## 2018-12-09 DIAGNOSIS — E1169 Type 2 diabetes mellitus with other specified complication: Secondary | ICD-10-CM | POA: Diagnosis not present

## 2018-12-09 DIAGNOSIS — E785 Hyperlipidemia, unspecified: Secondary | ICD-10-CM | POA: Diagnosis not present

## 2018-12-09 DIAGNOSIS — E119 Type 2 diabetes mellitus without complications: Secondary | ICD-10-CM | POA: Diagnosis not present

## 2018-12-09 DIAGNOSIS — I10 Essential (primary) hypertension: Secondary | ICD-10-CM | POA: Diagnosis not present

## 2018-12-09 DIAGNOSIS — Z794 Long term (current) use of insulin: Secondary | ICD-10-CM | POA: Diagnosis not present

## 2018-12-09 DIAGNOSIS — Z9884 Bariatric surgery status: Secondary | ICD-10-CM | POA: Diagnosis not present

## 2018-12-10 ENCOUNTER — Other Ambulatory Visit: Payer: Self-pay

## 2018-12-10 ENCOUNTER — Ambulatory Visit: Payer: BC Managed Care – PPO | Admitting: Internal Medicine

## 2018-12-10 ENCOUNTER — Encounter: Payer: Self-pay | Admitting: Internal Medicine

## 2018-12-10 VITALS — BP 138/82 | HR 83 | Temp 98.3°F | Ht 72.0 in | Wt 296.2 lb

## 2018-12-10 DIAGNOSIS — E1165 Type 2 diabetes mellitus with hyperglycemia: Secondary | ICD-10-CM

## 2018-12-10 DIAGNOSIS — R739 Hyperglycemia, unspecified: Secondary | ICD-10-CM | POA: Diagnosis not present

## 2018-12-10 DIAGNOSIS — Z794 Long term (current) use of insulin: Secondary | ICD-10-CM | POA: Diagnosis not present

## 2018-12-10 DIAGNOSIS — Z9884 Bariatric surgery status: Secondary | ICD-10-CM

## 2018-12-10 LAB — POCT GLYCOSYLATED HEMOGLOBIN (HGB A1C): Hemoglobin A1C: 7.6 % — AB (ref 4.0–5.6)

## 2018-12-10 MED ORDER — LANTUS SOLOSTAR 100 UNIT/ML ~~LOC~~ SOPN: 32.0000 [IU] | PEN_INJECTOR | Freq: Every day | SUBCUTANEOUS | 3 refills | Status: DC

## 2018-12-10 NOTE — Patient Instructions (Addendum)
-   Stop Levemir - Start Lantus at 32 units daily  - Increase Novolog to 10 units with each meal , if your pre-meal sugar is 200 or higher add 2 units of novolog to that meal  - Continue Invokana 300 mg daily     Choose healthy, lower carb lower calorie snacks: toss salad, cooked vegetables, cottage cheese, peanut butter, low fat cheese / string cheese, lower sodium deli meat, tuna salad or chicken salad     HOW TO TREAT LOW BLOOD SUGARS (Blood sugar LESS THAN 70 MG/DL)  Please follow the RULE OF 15 for the treatment of hypoglycemia treatment (when your (blood sugars are less than 70 mg/dL)    STEP 1: Take 15 grams of carbohydrates when your blood sugar is low, which includes:   3-4 GLUCOSE TABS  OR  3-4 OZ OF JUICE OR REGULAR SODA OR  ONE TUBE OF GLUCOSE GEL     STEP 2: RECHECK blood sugar in 15 MINUTES STEP 3: If your blood sugar is still low at the 15 minute recheck --> then, go back to STEP 1 and treat AGAIN with another 15 grams of carbohydrates.

## 2018-12-10 NOTE — Progress Notes (Signed)
Name: Todd Delgado  MRN/ DOB: [id number], 01/08/1961   Age/ Sex: 58 y.o., male    PCP: Eartha InchBadger, Michael C, MD   Reason for Endocrinology Evaluation: Type 2 Diabetes Mellitus     Date of Initial Endocrinology Visit: 12/11/2018     PATIENT IDENTIFIER: [name] is a 58 y.o. male with a past medical history of T2DM, S/P Weight loss sx (04/2017), OSA, HTN and dyslipidemia . The patient presented for initial endocrinology clinic visit on 12/11/2018 for consultative assistance with his diabetes management.    HPI: [name] was    Diagnosed with T2DM  In 1998 Prior Medications tried/Intolerance:Metformin- unclear, Byetta - breath didn;t smell good and GI side effects  Currently checking blood sugars 3 x / day, durin the day   Hypoglycemia episodes : yes           Symptoms: shaky                Frequency: 2/week Hemoglobin A1c has ranged from 7.0 % in 2018, peaking at 9.0%  Patient required assistance for hypoglycemia: no Patient has required hospitalization within the last 1 year from hyper or hypoglycemia: no  In terms of diet, the patient eats 3 meals and eats a snack at night . The pt avoids sugar-sweetened beverages.   Weight Loss :  Had a lap band sx  in 2009 and lost ~50 lbs weight loss. Pt had a revision of Lap Band with Roux-en-Y GB due to plataeu in weigh   he lost ~ 95 lbs.    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   HOME DIABETES REGIMEN: Levemir 30 units daily  Novolog 8 units TIDQAC and with a bedtime snack  Invokana 300 mg daily   Statin: yes ACE-I/ARB: Yes Prior Diabetic Education: Yes    CONTINUOUS GLUCOSE MONITORING RECORD INTERPRETATION    Dates of Recording: 7/15-7/28/20  Sensor description:Freestyle Libre  Results statistics:   CGM use % of time 76  Average and SD 180/34.4  Time in range     46   %  % Time Above 180 36  % Time above 250 14  % Time Below target 3    Glycemic patterns summary:Hyperglycemia as the day goes along   Hyperglycemic  episodes  Post-prandial  Hypoglycemic episodes occurred in the early morning   Overnight periods: hypoglycemia        DIABETIC COMPLICATIONS: Microvascular complications:    Denies: neuropathy, retinopathy, CKD  Last eye exam: Completed 11/2018  Macrovascular complications:    Denies: CAD, PVD, CVA   PAST HISTORY: Past Medical History:  Past Medical History:  Diagnosis Date  . Chest pain   . CHF (congestive heart failure) (HCC)   . Chronic kidney disease   . Diabetes mellitus without complication (HCC)   . Hyperlipemia   . Hypertension   . Impotence   . Morbid obesity (HCC)   . Post-herpetic polyneuropathy   . Sleep apnea   . Sleep apnea   . Vitamin D deficiency    Past Surgical History:  Past Surgical History:  Procedure Laterality Date  . BARIATRIC SURGERY  03/2017   Lap band to Roux-en-Y gastric bypass   . LAPAROSCOPIC GASTRIC BANDING  July 2009    Dr. Grier MittsK Earle      Social History:  reports that he has never smoked. He has never used smokeless tobacco. He reports that he does not drink alcohol or use drugs. Family History:  Family History  Problem Relation Age of  Onset  . Myasthenia gravis Mother   . Heart disease Father   . Diabetes Father   . Hypertension Father   . Heart failure Father   . Heart disease Other      HOME MEDICATIONS: Allergies as of 12/10/2018      Reactions   Spironolactone Other (See Comments)   gynecomastia      Medication List       Accurate as of December 10, 2018 11:59 PM. If you have any questions, ask your nurse or doctor.        STOP taking these medications   HumaLOG KwikPen 100 UNIT/ML KwikPen Generic drug: insulin lispro Stopped by: Scarlette ShortsIbtehal J , MD   insulin detemir 100 UNIT/ML injection Commonly known as: LEVEMIR Stopped by: Scarlette ShortsIbtehal J , MD     TAKE these medications   5-HTP 100 MG Caps Take by mouth.   amLODipine 5 MG tablet Commonly known as: NORVASC   atorvastatin 20 MG  tablet Commonly known as: LIPITOR Take 1 tablet (20 mg total) by mouth daily.   b complex vitamins tablet Take 1 tablet by mouth daily.   BIOTIN PO Take 75 mg by mouth.   canagliflozin 300 MG Tabs tablet Commonly known as: INVOKANA Take 300 mg by mouth daily before breakfast.   cholecalciferol 1000 units tablet Commonly known as: VITAMIN D Take 2,000 Units by mouth daily.   CINNAMON PO Take 1,000 mg by mouth.   cloNIDine 0.3 mg/24hr patch Commonly known as: CATAPRES - Dosed in mg/24 hr Place 0.3 mg onto the skin once a week.   CRANBERRY PO Take by mouth.   diltiazem 300 MG 24 hr capsule Commonly known as: CARDIZEM CD Take 1 capsule (300 mg total) by mouth daily.   FreeStyle [employer] Entertainment[employer]re Sensor System Misc by Does not apply route.   hydrALAZINE 25 MG tablet Commonly known as: APRESOLINE Take 1 tablet (25 mg total) by mouth 2 (two) times daily.   Lantus SoloStar 100 UNIT/ML Solostar Pen Generic drug: Insulin Glargine Inject 32 Units into the skin daily. Started by: Scarlette ShortsIbtehal J , MD   multivitamin with minerals tablet Take 1 tablet by mouth daily.   Nebivolol HCl 20 MG Tabs Commonly known as: Bystolic Take 1 tablet (20 mg total) by mouth daily.   NovoLOG FlexPen 100 UNIT/ML FlexPen Generic drug: insulin aspart INJECT 8 UNITS INTO THE SKIN 15 MINUTES BEFORE MEALS   PROBIOTIC DAILY PO Take by mouth daily.   sildenafil 50 MG tablet Commonly known as: VIAGRA Take 50 mg by mouth daily as needed for erectile dysfunction.   spironolactone 25 MG tablet Commonly known as: ALDACTONE Take 0.5 tablets (12.5 mg total) by mouth 2 (two) times daily.   tadalafil 20 MG tablet Commonly known as: CIALIS Take 1 tablet (20 mg total) by mouth daily as needed for erectile dysfunction.   UNABLE TO FIND Take by mouth daily. Med Name: MEGA RED  Take as directed.   valsartan-hydrochlorothiazide 320-25 MG tablet Commonly known as: DIOVAN-HCT Take 1 tablet by mouth every  morning.   vitamin C 500 MG tablet Commonly known as: ASCORBIC ACID Take 500 mg by mouth daily.        ALLERGIES: Allergies  Allergen Reactions  . Spironolactone Other (See Comments)    gynecomastia     REVIEW OF SYSTEMS: A comprehensive ROS was conducted with the patient and is negative except as per HPI and below:  Review of Systems  Constitutional: Positive for weight loss. Negative for  chills.  HENT: Negative for congestion and sore throat.   Respiratory: Negative for cough and shortness of breath.   Cardiovascular: Negative for chest pain and palpitations.  Gastrointestinal: Negative for diarrhea and nausea.  Genitourinary: Negative for frequency.  Neurological: Negative for tingling and tremors.  Endo/Heme/Allergies: Negative for polydipsia.  Psychiatric/Behavioral: Negative for depression. The patient is not nervous/anxious.       OBJECTIVE:   VITAL SIGNS: BP 138/82 (BP Location: Left Arm, Patient Position: Sitting, Cuff Size: Large)   Pulse 83   Temp 98.3 F (36.8 C)   Ht 6' (1.829 m)   Wt 296 lb 3.2 oz (134.4 kg)   SpO2 97%   BMI 40.17 kg/m    PHYSICAL EXAM:  General: Pt appears well and is in NAD  Hydration: Well-hydrated with moist mucous membranes and good skin turgor  HEENT: Head: Unremarkable with good dentition. Oropharynx clear without exudate.  Eyes: External eye exam normal without stare, lid lag or exophthalmos.  EOM intact.   Neck: General: Supple without adenopathy or carotid bruits. Thyroid: Thyroid size normal.  No goiter or nodules appreciated. No thyroid bruit.  Lungs: Clear with good BS bilat with no rales, rhonchi, or wheezes  Heart: RRR with normal S1 and S2 and no gallops; no murmurs; no rub  Abdomen: Normoactive bowel sounds, soft, nontender, without masses or organomegaly palpable  Extremities:  Lower extremities - No pretibial edema. No lesions.  Skin: Normal texture and temperature to palpation. No rash noted. No Acanthosis  nigricans/skin tags. Minimal lipohypertrophy areas at the LLQ  Neuro: MS is good with appropriate affect, pt is alert and Ox3    DM foot exam: 12/11/2018  The skin of the feet is intact without sores or ulcerations. The pedal pulses are 2+ on right and 2+ on left. The sensation is intact to a screening 5.07, 10 gram monofilament bilaterally   DATA REVIEWED:  Lab Results  Component Value Date   HGBA1C 7.6 (A) 12/10/2018   Lab Results  Component Value Date   LDLCALC 117 12/23/2014   CREATININE 1.28 12/23/2014    Lab Results  Component Value Date   CHOL 204 (H) 12/23/2014   HDL 42 12/23/2014   LDLCALC 117 12/23/2014   TRIG 227 (H) 12/23/2014   CHOLHDL 4.9 12/23/2014        ASSESSMENT / PLAN / RECOMMENDATIONS:   1) Type 2 Diabetes Mellitus, Sub-Optimally controlled, Without complications - Most recent A1c of 7.6 %. Goal A1c < 7.0 %.    Plan: GENERAL: I have discussed with the patient the pathophysiology of diabetes. We went over the natural progression of the disease. We talked about both insulin resistance and insulin deficiency. We stressed the importance of lifestyle changes including diet and exercise. I explained the complications associated with diabetes including retinopathy, nephropathy, neuropathy as well as increased risk of cardiovascular disease. We went over the benefit seen with glycemic control.    I explained to the patient that diabetic patients are at higher than normal risk for amputations.   I have advised him to switch levemir to a longer acting basal insulin, will increase novolog as he was noted with postprandial hyperglycemia Discussed pharmacokinetics of basal/bolus insulin and the importance of taking prandial insulin with meals.   We also discussed avoiding sugar-sweetened beverages and snacks, when possible.    MEDICATIONS: - Stop Levemir - Start Lantus at 32 units daily  - Increase Novolog to 10 units with each meal , if your pre-meal sugar  is 200 or higher add 2 units of novolog to that meal  - Continue Invokana 300 mg daily   EDUCATION / INSTRUCTIONS:  BG monitoring instructions: Patient is instructed to check his blood sugars 4 times a day, before meals and bedtime.  Call [facility] if: BG persistently < 70 or > 300. . I reviewed the Rule of 15 for the treatment of hypoglycemia in detail with the patient. Literature supplied.   2) Diabetic complications:   Eye: Does not have known diabetic retinopathy.   Neuro/ Feet: Does not have known diabetic peripheral neuropathy.  Renal: Patient does not have known baseline CKD. He is on an ACEI/ARB at present.   3) Lipids: Patient is  on a statin.    4) Hypertension: He is at goal of < 140/90 mmHg.   5) Hx of Bariatric Surgery :  - Pt has lost ~ 125 lbs since his last surgery.  - He is compliant with vitamin intake     F/u in 2 months     Signed electronically by: Lyndle HerrlichAbby Jaralla , MD  [facility]  [facility] [address] [place], [place] [address] Phone: [phone] FAX: [phone]   CC: Eartha InchBadger, Michael C, MD [address] [place] [address] Phone: [phone]  Fax: [phone]    Return to Endocrinology clinic as below: Future Appointments  Date Time Provider Department Center  02/04/2019  3:20 PM , Konrad DoloresIbtehal Jaralla, MD [facility] None  02/14/2019  8:40 AM Lennette BihariKelly, Thomas A, MD [facility]

## 2018-12-11 ENCOUNTER — Encounter: Payer: Self-pay | Admitting: Internal Medicine

## 2018-12-11 DIAGNOSIS — Z9884 Bariatric surgery status: Secondary | ICD-10-CM | POA: Insufficient documentation

## 2018-12-11 LAB — BASIC METABOLIC PANEL
BUN: 9 mg/dL (ref 6–23)
CO2: 28 mEq/L (ref 19–32)
Calcium: 9.1 mg/dL (ref 8.4–10.5)
Chloride: 104 mEq/L (ref 96–112)
Creatinine, Ser: 0.92 mg/dL (ref 0.40–1.50)
GFR: 102.14 mL/min (ref >60.00)
Glucose, Bld: 186 mg/dL — ABNORMAL HIGH (ref 70–99)
Potassium: 3.8 mEq/L (ref 3.5–5.1)
Sodium: 139 mEq/L (ref 135–145)

## 2019-01-31 ENCOUNTER — Other Ambulatory Visit: Payer: Self-pay

## 2019-02-04 ENCOUNTER — Other Ambulatory Visit: Payer: Self-pay

## 2019-02-04 ENCOUNTER — Ambulatory Visit: Payer: BC Managed Care – PPO | Admitting: Internal Medicine

## 2019-02-04 VITALS — BP 142/82 | HR 65 | Temp 98.4°F | Ht 72.0 in | Wt 303.8 lb

## 2019-02-04 DIAGNOSIS — Z794 Long term (current) use of insulin: Secondary | ICD-10-CM

## 2019-02-04 DIAGNOSIS — E1165 Type 2 diabetes mellitus with hyperglycemia: Secondary | ICD-10-CM

## 2019-02-04 NOTE — Progress Notes (Signed)
Name: Todd Delgado  Age/ Sex: 58 y.o., male   MRN/ DOB: [id number], 02/09/1961     PCP: Eartha InchBadger, Michael C, MD   Reason for Endocrinology Evaluation: Type 2 Diabetes Mellitus  Initial Endocrine Consultative Visit: 12/10/2018    PATIENT IDENTIFIER: Todd Delgado is a 58 y.o. male with a past medical history of T2DM, S/P Weight loss sx (04/2017), OSA, HTN and dyslipidemia . The patient has followed with Endocrinology clinic since 12/10/2018 for consultative assistance with management of his diabetes.  DIABETIC HISTORY:  Todd Delgado was diagnosed with T2DM in 1998. He has been on oral glycemic agents in the past with some reported intolerance. (Metformin- unclear, Byetta - halitosis and GI side effects ). His hemoglobin A1c has ranged from 7.0 % in 2018, peaking at 9.0%      Weight Loss :  Had a lap band sx  in 2009 and lost ~50 lbs weight loss. Pt had a revision of Lap Band with Roux-en-Y GB due to plataeu in weigh which results in a ~ 95 lbs loss.    On his initial visit to our clinic his A1c was 7.6%, was on MDI regimen and Invokana.   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 Switched levemir to lantus , increased Novolog and continued Invokana   Today (02/04/2019): Todd Delgado is here for a follow up on diabetes care. He checks his blood sugars 3 times daily, through freestyle libre. The patient has had hypoglycemic episodes since the last clinic visit, which typically occur 2 x / week- most often occuring early morning. The patient is symptomatic with these episodes. Otherwise, the patient has not required any recent emergency interventions for hypoglycemia and has not had recent hospitalizations secondary to hyper or hypoglycemic episodes.    ROS: As per HPI and as detailed below: Review of Systems  Constitutional: Negative for chills and fever.  HENT: Negative for congestion and sore throat.    Respiratory: Negative for cough and shortness of breath.   Cardiovascular: Negative for chest pain and palpitations.  Gastrointestinal: Negative for diarrhea and nausea.      HOME DIABETES REGIMEN:   Lantus 32 units daily   Novolog to 10 units with each meal , if your pre-meal sugar is 200 or higher add 2 units of novolog to that meal   Invokana 300 mg daily      CONTINUOUS GLUCOSE MONITORING RECORD INTERPRETATION    Dates of Recording: 9/9-9/22/20  Sensor description:Freestyle Libre  Results statistics:   CGM use % of time 72  Average and SD 132/33.6  Time in range      77  %  % Time Above 180 16  % Time above 250 1  % Time Below target 1    Glycemic patterns summary: hypoglycemia at night between MN-6 AM , occasional hyperglycemia at lunch and supper  Hyperglycemic episodes  Post-prandial   Hypoglycemic episodes occurred during the night   Overnight periods: decrease    HISTORY:  Past Medical History:  Past Medical History:  Diagnosis Date  . Chest pain   . CHF (congestive heart failure) (HCC)   . Chronic kidney disease   . Diabetes mellitus without complication (HCC)   . Hyperlipemia   . Hypertension   . Impotence   . Morbid obesity (HCC)   . Post-herpetic polyneuropathy   . Sleep apnea   . Sleep apnea   . Vitamin  D deficiency    Past Surgical History:  Past Surgical History:  Procedure Laterality Date  . BARIATRIC SURGERY  03/2017   Lap band to Roux-en-Y gastric bypass   . LAPAROSCOPIC GASTRIC BANDING  July 2009    Dr. Grier Mitts    Social History:  reports that he has never smoked. He has never used smokeless tobacco. He reports that he does not drink alcohol or use drugs. Family History:  Family History  Problem Relation Age of Onset  . Myasthenia gravis Mother   . Heart disease Father   . Diabetes Father   . Hypertension Father   . Heart failure Father   . Heart disease Other      HOME MEDICATIONS: Allergies as of 02/04/2019       Reactions   Spironolactone Other (See Comments)   gynecomastia      Medication List       Accurate as of February 04, 2019  4:02 PM. If you have any questions, ask your nurse or doctor.        5-HTP 100 MG Caps Take by mouth.   amLODipine 5 MG tablet Commonly known as: NORVASC   atorvastatin 20 MG tablet Commonly known as: LIPITOR Take 1 tablet (20 mg total) by mouth daily.   b complex vitamins tablet Take 1 tablet by mouth daily.   BIOTIN PO Take 75 mg by mouth.   canagliflozin 300 MG Tabs tablet Commonly known as: INVOKANA Take 300 mg by mouth daily before breakfast.   cholecalciferol 1000 units tablet Commonly known as: VITAMIN D Take 2,000 Units by mouth daily.   CINNAMON PO Take 1,000 mg by mouth.   cloNIDine 0.3 mg/24hr patch Commonly known as: CATAPRES - Dosed in mg/24 hr Place 0.3 mg onto the skin once a week.   CRANBERRY PO Take by mouth.   diltiazem 300 MG 24 hr capsule Commonly known as: CARDIZEM CD Take 1 capsule (300 mg total) by mouth daily.   FreeStyle [employer] Misc by Does not apply route.   hydrALAZINE 25 MG tablet Commonly known as: APRESOLINE Take 1 tablet (25 mg total) by mouth 2 (two) times daily.   Lantus SoloStar 100 UNIT/ML Solostar Pen Generic drug: Insulin Glargine Inject 32 Units into the skin daily.   multivitamin with minerals tablet Take 1 tablet by mouth daily.   Nebivolol HCl 20 MG Tabs Commonly known as: Bystolic Take 1 tablet (20 mg total) by mouth daily.   NovoLOG FlexPen 100 UNIT/ML FlexPen Generic drug: insulin aspart INJECT 8 UNITS INTO THE SKIN 15 MINUTES BEFORE MEALS   PROBIOTIC DAILY PO Take by mouth daily.   sildenafil 50 MG tablet Commonly known as: VIAGRA Take 50 mg by mouth daily as needed for erectile dysfunction.   spironolactone 25 MG tablet Commonly known as: ALDACTONE Take 0.5 tablets (12.5 mg total) by mouth 2 (two) times daily.   tadalafil 20 MG tablet Commonly known as:  CIALIS Take 1 tablet (20 mg total) by mouth daily as needed for erectile dysfunction.   UNABLE TO FIND Take by mouth daily. Med Name: MEGA RED  Take as directed.   valsartan-hydrochlorothiazide 320-25 MG tablet Commonly known as: DIOVAN-HCT Take 1 tablet by mouth every morning.   vitamin C 500 MG tablet Commonly known as: ASCORBIC ACID Take 500 mg by mouth daily.        OBJECTIVE:   Vital Signs: BP (!) 142/82 (BP Location: Left Arm, Patient Position: Sitting, Cuff Size: Large)  Pulse 65   Temp 98.4 F (36.9 C)   Ht 6' (1.829 m)   Wt (!) 303 lb 12.8 oz (137.8 kg)   SpO2 98%   BMI 41.20 kg/m   Wt Readings from Last 3 Encounters:  02/04/19 (!) 303 lb 12.8 oz (137.8 kg)  12/10/18 296 lb 3.2 oz (134.4 kg)  04/15/15 (!) 349 lb (158.3 kg)     Exam: General: Pt appears well and is in NAD  Neck: General: Supple without adenopathy. Thyroid: Thyroid size normal.  No goiter or nodules appreciated. No thyroid bruit.  Lungs: Clear with good BS bilat with no rales, rhonchi, or wheezes  Heart: RRR with normal S1 and S2 and no gallops; no murmurs; no rub  Abdomen: Normoactive bowel sounds, soft, nontender, without masses or organomegaly palpable  Extremities: Trace pretibial edema. No tremor.  Skin: Normal texture and temperature to palpation.   Neuro: MS is good with appropriate affect, pt is alert and Ox3     DM foot exam: 12/11/2018  The skin of the feet is intact without sores or ulcerations. The pedal pulses are 2+ on right and 2+ on left. The sensation is intact to a screening 5.07, 10 gram monofilament bilaterally    DATA REVIEWED:  Lab Results  Component Value Date   HGBA1C 7.6 (A) 12/10/2018   Results for CAREL, BEAN Delgado "JOE" (MRN [id number]) as of 02/04/2019 16:00  Ref. Range 12/10/2018 16:34  Sodium Latest Ref Range: 135 - 145 mEq/L 139  Potassium Latest Ref Range: 3.5 - 5.1 mEq/L 3.8  Chloride Latest Ref Range: 96 - 112 mEq/L 104  CO2 Latest Ref  Range: 19 - 32 mEq/L 28  Glucose Latest Ref Range: 70 - 99 mg/dL 096 (H)  BUN Latest Ref Range: 6 - 23 mg/dL 9  Creatinine Latest Ref Range: 0.40 - 1.50 mg/dL 2.83  Calcium Latest Ref Range: 8.4 - 10.5 mg/dL 9.1  GFR Latest Ref Range: >60.00 mL/min 102.14            ASSESSMENT / PLAN / RECOMMENDATIONS:   1) Type 2 Diabetes Mellitus, Sub-Optimally controlled, Without complications - Most recent A1c of 7.6 %. Goal A1c < 7.0 %.     - Pt with improved glycemic control but he has been having hypoglycemia at night, I have advised him to reduce lantus dose but the pt believes this is due to taking novolog for a snack at bedtime and not eating.  - I have discouraged him from taking Novolog for a snack or before meals when he is not sure if he is going to eat,we also discussed the dangers of taking novolog without eating and getting on the road. He has been doing better with taking the Novolog pen with him.   - We discussed other Add-On options such as Metformin and GLP-1 agonists to see if we can get him off prandial insulin but he is happy with his readings at this time and would like to continue current regimen.      MEDICATIONS:  Lantus 32 units daily   Novolog 10 units TIDQAC  Invokana 300 mg daily   CF (BG-130/30)   EDUCATION / INSTRUCTIONS:  BG monitoring instructions: Patient is instructed to check his blood sugars 4 times a day, before meals and bedtime .  Call [facility] if: BG persistently < 70 or > 300. . I reviewed the Rule of 15 for the treatment of hypoglycemia in detail with the patient. Literature supplied.  F/U in 3 months   Signed electronically by: Mack Guise, MD  [facility]  [facility] [address] [place], [place] [address] Phone: [phone] FAX: [phone]   CC: Chesley Noon, MD [place] [address] Phone: [phone]  Fax: [phone]   Return to Endocrinology clinic as below: Future Appointments  Date Time Provider [place]  02/14/2019  8:40 AM Troy Sine, MD [facility]

## 2019-02-04 NOTE — Patient Instructions (Addendum)
-    Lantus at 32 units daily  - Novolog to 10 units with each meal  - Continue Invokana 300 mg daily  - Novolog correctional insulin: ADD extra units on insulin to your meal-time Novolog dose if your blood sugars are higher than 160. Use the scale below to help guide you:   Blood sugar before meal Number of units to inject  Less than 160 0 unit  161 -  190 1 units  191 -  220 2 units  221 -  250 3 units  251 -  280 4 units       HOW TO TREAT LOW BLOOD SUGARS (Blood sugar LESS THAN 70 MG/DL)  Please follow the RULE OF 15 for the treatment of hypoglycemia treatment (when your (blood sugars are less than 70 mg/dL)    STEP 1: Take 15 grams of carbohydrates when your blood sugar is low, which includes:   3-4 GLUCOSE TABS  OR  3-4 OZ OF JUICE OR REGULAR SODA OR  ONE TUBE OF GLUCOSE GEL     STEP 2: RECHECK blood sugar in 15 MINUTES STEP 3: If your blood sugar is still low at the 15 minute recheck --> then, go back to STEP 1 and treat AGAIN with another 15 grams of carbohydrates.

## 2019-02-05 ENCOUNTER — Encounter: Payer: Self-pay | Admitting: Internal Medicine

## 2019-02-10 ENCOUNTER — Ambulatory Visit: Payer: Self-pay | Admitting: Cardiovascular Disease

## 2019-02-11 ENCOUNTER — Telehealth: Payer: Self-pay | Admitting: Cardiovascular Disease

## 2019-02-11 NOTE — Telephone Encounter (Signed)
Spoke to pt concerning appointment with Dr. Claiborne Billings on 10/2. Pt stated he needed to cancel appt due to coming back from vacation that day. Pt stated he could do virtual visit that day if later in the day. Informed pt that Dr. Claiborne Billings is quarter day that day and latest appt time is 9:40. Pt stated will need to reschedule, and would like to keep appointment virtual.   Rescheduled pt to 10/23 at 3 PM for video visit with Dr. Claiborne Billings. Explained to pt that I sent him a message through [employer] for him to review and can call if any questions. Pt verbalized thanks.

## 2019-02-11 NOTE — Telephone Encounter (Signed)
Thank you :)

## 2019-02-14 ENCOUNTER — Telehealth: Payer: Self-pay | Admitting: Cardiovascular Disease

## 2019-03-07 ENCOUNTER — Ambulatory Visit: Payer: Self-pay | Admitting: Cardiovascular Disease

## 2019-03-19 DIAGNOSIS — Z9884 Bariatric surgery status: Secondary | ICD-10-CM | POA: Diagnosis not present

## 2019-03-19 DIAGNOSIS — K912 Postsurgical malabsorption, not elsewhere classified: Secondary | ICD-10-CM | POA: Diagnosis not present

## 2019-05-01 ENCOUNTER — Encounter: Payer: Self-pay | Admitting: Cardiovascular Disease

## 2019-05-01 ENCOUNTER — Telehealth (INDEPENDENT_AMBULATORY_CARE_PROVIDER_SITE_OTHER): Payer: BC Managed Care – PPO | Admitting: Cardiovascular Disease

## 2019-05-01 DIAGNOSIS — I10 Essential (primary) hypertension: Secondary | ICD-10-CM

## 2019-05-01 DIAGNOSIS — E785 Hyperlipidemia, unspecified: Secondary | ICD-10-CM

## 2019-05-01 DIAGNOSIS — E118 Type 2 diabetes mellitus with unspecified complications: Secondary | ICD-10-CM

## 2019-05-01 DIAGNOSIS — G4730 Sleep apnea, unspecified: Secondary | ICD-10-CM

## 2019-05-01 MED ORDER — LOSARTAN POTASSIUM 50 MG PO TABS: 50.0000 mg | ORAL_TABLET | Freq: Two times a day (BID) | ORAL | 1 refills | Status: DC

## 2019-05-01 NOTE — Patient Instructions (Signed)
Medication Instructions:   Increase Losartan to 50 mg (1 tab) in the morning and 25 mg (1/2 tab) in the evening for two weeks. If your blood pressure is still in the upper 130s/80s after the two weeks, then start taking Losartan 50 mg twice daily.  *If you need a refill on your cardiac medications before your next appointment, please call your pharmacy*   Follow-Up: At [facility], you and your health needs are our priority.  As part of our continuing mission to provide you with exceptional heart care, we have created designated Provider Care Teams.  These Care Teams include your primary Cardiologist (physician) and Advanced Practice Providers (APPs -  Physician Assistants and Nurse Practitioners) who all work together to provide you with the care you need, when you need it.  Your next appointment:   6 month(s)  The format for your next appointment:   In Person  Provider:   You may see Shelva Majestic, MD or one of the following Advanced Practice Providers on your designated Care Team:    Almyra Deforest, PA-C  Fabian Sharp, PA-C or   Roby Lofts, [place]   Other Instructions Please call our office 2 months in advance to schedule your six month follow-up appointment.

## 2019-05-01 NOTE — Progress Notes (Addendum)
Virtual Visit via Telephone Note   This visit type was conducted due to national recommendations for restrictions regarding the COVID-19 Pandemic (e.g. social distancing) in an effort to limit this patient's exposure and mitigate transmission in our community.  Due to his co-morbid illnesses, this patient is at least at moderate risk for complications without adequate follow up.  This format is felt to be most appropriate for this patient at this time.  The patient did not have access to video technology/had technical difficulties with video requiring transitioning to audio format only (telephone).  All issues noted in this document were discussed and addressed.  No physical exam could be performed with this format.  Please refer to the patient's chart for his  consent to telehealth for [facility].   Date:  05/02/2019   ID:  Todd Delgado, DOB Mar 07, 1961, MRN [id number]  Patient Location: Home Provider Location: Home  PCP:  Eartha Inch, MD  Cardiologist:  Nicki Guadalajara, MD Electrophysiologist:  None   Evaluation Performed: Reestablishment of care, last office visit April 15, 2015  Chief Complaint:    History of Present Illness:    Todd Delgado is a 58 y.o. male who I had last evaluated in December 2016.  Mr. Plata has a history of super morbid obesity with a maximum weight 415 pounds.  He had under gone LAP-BAND gastric bypass surgery and had a maximum weight reduction of approximate 90 pounds but has gained some of this weight back.   He has a history of hypertension, severe obstructive sleep apnea for which he has been on BiPAP therapy, and type 2 diabetes mellitus.   Several years prior to his last visit his insurance changed and he was forced to change his medications.  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  He saw Nada Boozer in April 2016 at which time his blood pressure was 170/110.  It does not appear  that she made any medication adjustments.  An echo Doppler study on 10/21/2014  revealed a hyperdynamic LV function with an EF of 65-70%, but with severe concentric LVH.  He had grade 1 diastolic dysfunction.  It was felt that the severe LVH was most likely due to hypertension rather than infiltrative cardiomyopathy.    Most recently, he has been on Valsartan HCT 320/25, spironolactone 12.5 mg twice a day, Bystolic 20 mg daily, hydralazine 25 mg twice a day, diltiazem CD 300 mg daily, and Catapres 0.3 mg /24-hour patch.  Follow-up blood work in August 2016. Lipid studies were elevated with a total cluster 204, triglycerides 227, VLDL 45, and LDL 117.  He was supposed to start atorvastatin 20 mg but apparently had not initiated treatment The BMP was normal at 13.8.  I last saw him in December 2016 he denied chest pounding.  He was continuing to use BiPAP therapy.  He was trying to avoid sodium.  Issues with erectile dysfunction and remotely had used Cialis intermittently.   Since I last saw him, his LAP-BAND was removed and he subsequently later underwent gastric banding in November 2018.  He has continued to lose weight and his weight had dropped to 85 but recently admits to some weight gain with a current weight now at 298.  He has continued to use BiPAP therapy and has a 2014 Respironics BiPAP machine.  He admits to 100% compliance.  According to his verbal report of his medication list to me he has been on  a medical regimen for hypertension including losartan 50 mg, amlodipine 10 mg, clonidine patch 0.3 mg weekly, Bystolic 20 mg twice a day.  He does note some mild blood pressure lability but most of the time blood pressure seems to be fairly stable.  He also is diabetic on Invokana, Levemir, in addition to NovoLog insulin.  He denies any chest pain.  He denies palpitations.  He apparently had laboratory in November by his primary physician which showed a cholesterol of 117, LDL cholesterol 48,  triglycerides 52, and HDL cholesterol 57 on atorvastatin 20 mg daily.  He presents for evaluation.   The patient does not have symptoms concerning for COVID-19 infection (fever, chills, cough, or new shortness of breath).    Past Medical History:  Diagnosis Date  . Chest pain   . CHF (congestive heart failure) (HCC)   . Chronic kidney disease   . Diabetes mellitus without complication (HCC)   . Hyperlipemia   . Hypertension   . Impotence   . Morbid obesity (HCC)   . Post-herpetic polyneuropathy   . Sleep apnea   . Sleep apnea   . Vitamin D deficiency    Past Surgical History:  Procedure Laterality Date  . BARIATRIC SURGERY  03/2017   Lap band to Roux-en-Y gastric bypass   . LAPAROSCOPIC GASTRIC BANDING  July 2009    Dr. Grier MittsK Earle     Current Meds  Medication Sig  . 5-Hydroxytryptophan (5-HTP) 100 MG CAPS Take by mouth.  Marland Kitchen. amLODipine (NORVASC) 5 MG tablet   . atorvastatin (LIPITOR) 20 MG tablet Take 1 tablet (20 mg total) by mouth daily.  Marland Kitchen. b complex vitamins tablet Take 1 tablet by mouth daily.  Marland Kitchen. BIOTIN PO Take 75 mg by mouth.  . canagliflozin (INVOKANA) 300 MG TABS tablet Take 300 mg by mouth daily before breakfast.  . cholecalciferol (VITAMIN D) 1000 UNITS tablet Take 2,000 Units by mouth daily.  Marland Kitchen. CINNAMON PO Take 1,000 mg by mouth.  . cloNIDine (CATAPRES - DOSED IN MG/24 HR) 0.3 mg/24hr patch Place 0.3 mg onto the skin once a week.   . Continuous Blood Gluc Sensor (FREESTYLE LIBRE SENSOR SYSTEM) MISC by Does not apply route.  Marland Kitchen. CRANBERRY PO Take by mouth.  . diltiazem (CARDIZEM CD) 300 MG 24 hr capsule Take 1 capsule (300 mg total) by mouth daily.  . hydrALAZINE (APRESOLINE) 25 MG tablet Take 1 tablet (25 mg total) by mouth 2 (two) times daily.  . insulin aspart (NOVOLOG FLEXPEN) 100 UNIT/ML FlexPen INJECT 8 UNITS INTO THE SKIN 15 MINUTES BEFORE MEALS  . Insulin Glargine (LANTUS SOLOSTAR) 100 UNIT/ML Solostar Pen Inject 32 Units into the skin daily.  Marland Kitchen. losartan  (COZAAR) 50 MG tablet Take 1 tablet (50 mg total) by mouth 2 (two) times daily. As directed.  . Multiple Vitamins-Minerals (MULTIVITAMIN WITH MINERALS) tablet Take 1 tablet by mouth daily.  . Nebivolol HCl (BYSTOLIC) 20 MG TABS Take 1 tablet (20 mg total) by mouth daily.  . Probiotic Product (PROBIOTIC DAILY PO) Take by mouth daily.  . sildenafil (VIAGRA) 50 MG tablet Take 50 mg by mouth daily as needed for erectile dysfunction.  Marland Kitchen. spironolactone (ALDACTONE) 25 MG tablet Take 0.5 tablets (12.5 mg total) by mouth 2 (two) times daily.  . tadalafil (CIALIS) 20 MG tablet Take 1 tablet (20 mg total) by mouth daily as needed for erectile dysfunction.  Marland Kitchen. UNABLE TO FIND Take by mouth daily. Med Name: MEGA RED  Take as directed.  .Marland Kitchen  vitamin C (ASCORBIC ACID) 500 MG tablet Take 500 mg by mouth daily.  . [DISCONTINUED] losartan (COZAAR) 50 MG tablet Take 50 mg by mouth daily.     Allergies:   Spironolactone   Social History   Tobacco Use  . Smoking status: Never Smoker  . Smokeless tobacco: Never Used  Substance Use Topics  . Alcohol use: No    Alcohol/week: 0.0 standard drinks  . 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  ROS:   Please see the history of present illness.    Patient admits to purposeful weight loss.  Peak weight had been for 15 prior to his initial gastric bypass surgery.  Lowest weight was 285, slight 13 pound weight gain recently No fevers chills night sweats No cough No chest pain No palpitations Positive for diabetes mellitus No history of GI bleeding No significant leg swelling Positive for OSA on BiPAP All other systems reviewed and are negative.   Prior CV studies:   The following studies were reviewed  today:  I have reviewed recent laboratory  Labs/Other Tests and Data Reviewed:    EKG:  An ECG dated April 15, 2015 was personally reviewed today and demonstrated:  Normal sinus rhythm at 65, left anterior hemiblock, no ectopy  Recent Labs: 12/10/2018: BUN 9; Creatinine, Ser 0.92; Potassium 3.8; Sodium 139   Recent Lipid Panel Lab Results  Component Value Date/Time   CHOL 204 (H) 12/23/2014 04:56 PM   TRIG 227 (H) 12/23/2014 04:56 PM   HDL 42 12/23/2014 04:56 PM   CHOLHDL 4.9 12/23/2014 04:56 PM   LDLCALC 117 12/23/2014 04:56 PM    Wt Readings from Last 3 Encounters:  05/01/19 298 lb (135.2 kg)  02/04/19 (!) 303 lb 12.8 oz (137.8 kg)  12/10/18 296 lb 3.2 oz (134.4 kg)     Objective:    Vital Signs:  BP 135/87   Pulse 72   Ht 6' (1.829 m)   Wt 298 lb (135.2 kg)   BMI 40.42 kg/m    Since this was a virtual visit I could not physically examine the patient. Patient states that when he had checked his blood pressure earlier he had been rushing and this resulted in significant blood pressure elevation at 169/103. During the evaluation, repeat blood pressure was 135/87 Normal breathing No wheezing Rhythm is regular without ectopy No chest tenderness according to the patient No abdominal tenderness No leg swelling No neurologic symptoms Using BiPAP with 100% compliance  ASSESSMENT & PLAN:    1. Essential hypertension: He has been on losartan 50 mg, amlodipine 10 mg, clonidine 0.3 mg weekly patch, and Bystolic 20 mg twice a day according to his verbal report.  However subsequent insertion of medications by the nurse also listed hydralazine as well as spironolactone.  Initial blood pressure was significantly elevated but he had been rushing.  Repeat blood pressure was significantly improved at 135/87.  His resting pulse is in the 70s.  Recommended increasing losartan and take 50 mg in the morning and 25 mg at night for the first week and if he still notes systolic blood  pressure in the mid 130s or above to further titrate to 50 mg twice a day. 2. Morbid obesity: Status post  gastric bypass procedures x2.  Peak weight for 15 pounds, recent weight today 298.  He has a weight goal of 250 pounds.  Discussed exercise in addition to diet. 3. Hyperlipidemia with target less than 70 in this diabetic male: Currently on atorvastatin 20 mg.  Most recent lipid studies from March 19, 2019 reviewed with total cholesterol 117, LDL 48, triglycerides 52, and HDL 57.  Continue current therapy 4. OSA on BiPAP: He admits to 100% compliance.  His machine is not wireless and data could not be obtained today.  He is sleeping well.  He denies breakthrough snoring.  His sleep is restorative.  He denies significant nocturia.  There is no daytime sleepiness 5. Type 2 diabetes mellitus: Currently on Invokana, Lantus and NovoLog insulin.  He is followed by Dr. Kelton Pillar for endocrinology 6. Lifestyle: He states he has been exercising 5 to 6 days/week, typically walking.  COVID-19 Education: The signs and symptoms of COVID-19 were discussed with the patient and how to seek care for testing (follow up with PCP or arrange E-visit).  The importance of social distancing was discussed today.  Time:   Today, I have spent 25 minutes with the patient with telehealth technology discussing the above problems.     Medication Adjustments/Labs and Tests Ordered: Current medicines are reviewed at length with the patient today.  Concerns regarding medicines are outlined above.   Tests Ordered: No orders of the defined types were placed in this encounter.   Medication Changes: Meds ordered this encounter  Medications  . losartan (COZAAR) 50 MG tablet    Sig: Take 1 tablet (50 mg total) by mouth 2 (two) times daily. As directed.    Dispense:  180 tablet    Refill:  1    Follow Up:  In Person in 4 to 6 months  Signed, Shelva Majestic, MD  05/02/2019 12:36 PM    [place]

## 2019-05-02 ENCOUNTER — Encounter: Payer: Self-pay | Admitting: Cardiovascular Disease

## 2019-05-05 ENCOUNTER — Other Ambulatory Visit: Payer: Self-pay

## 2019-05-07 ENCOUNTER — Ambulatory Visit: Payer: BC Managed Care – PPO | Admitting: Internal Medicine

## 2019-05-07 ENCOUNTER — Other Ambulatory Visit: Payer: Self-pay

## 2019-05-07 VITALS — BP 152/92 | HR 70 | Temp 98.4°F | Ht 72.0 in | Wt 310.4 lb

## 2019-05-07 DIAGNOSIS — Z794 Long term (current) use of insulin: Secondary | ICD-10-CM

## 2019-05-07 DIAGNOSIS — E1165 Type 2 diabetes mellitus with hyperglycemia: Secondary | ICD-10-CM | POA: Diagnosis not present

## 2019-05-07 DIAGNOSIS — E119 Type 2 diabetes mellitus without complications: Secondary | ICD-10-CM

## 2019-05-07 LAB — POCT GLYCOSYLATED HEMOGLOBIN (HGB A1C): Hemoglobin A1C: 6.2 % — AB (ref 4.0–5.6)

## 2019-05-07 NOTE — Patient Instructions (Signed)
-    Decrease Lantus to  26 units daily  - Novolog  10 units with Breakfast and Lunch but increase to 12 units with Supper   - Continue Invokana 300 mg daily   - Novolog correctional insulin: ADD extra units on insulin to your meal-time Novolog dose if your blood sugars are higher than 160. Use the scale below to help guide you:   Blood sugar before meal Number of units to inject  Less than 160 0 unit  161 -  190 1 units  191 -  220 2 units  221 -  250 3 units  251 -  280 4 units       HOW TO TREAT LOW BLOOD SUGARS (Blood sugar LESS THAN 70 MG/DL)  Please follow the RULE OF 15 for the treatment of hypoglycemia treatment (when your (blood sugars are less than 70 mg/dL)    STEP 1: Take 15 grams of carbohydrates when your blood sugar is low, which includes:   3-4 GLUCOSE TABS  OR  3-4 OZ OF JUICE OR REGULAR SODA OR  ONE TUBE OF GLUCOSE GEL     STEP 2: RECHECK blood sugar in 15 MINUTES STEP 3: If your blood sugar is still low at the 15 minute recheck --> then, go back to STEP 1 and treat AGAIN with another 15 grams of carbohydrates.

## 2019-05-07 NOTE — Progress Notes (Signed)
Name: Todd Delgado  Age/ Sex: 58 y.o., male   MRN/ DOB: [id number], 03/19/1961     PCP: Eartha InchBadger, Michael C, MD   Reason for Endocrinology Evaluation: Type 2 Diabetes Mellitus  Initial Endocrine Consultative Visit: 12/10/2018    PATIENT IDENTIFIER: Mr. Todd Todd Delgado is a 58 y.o. male with a past medical history of T2DM, S/P Weight loss sx (04/2017), OSA, HTN and dyslipidemia . The patient has followed with Endocrinology clinic since 12/10/2018 for consultative assistance with management of his diabetes.  DIABETIC HISTORY:  Todd Delgado was diagnosed with T2DM in 1998. He has been on oral glycemic agents in the past with some reported intolerance. (Metformin- unclear, Byetta - halitosis and GI side effects ). His hemoglobin A1c has ranged from 7.0 % in 2018, peaking at 9.0%      Weight Loss :  Had a lap band sx  in 2009 and lost ~50 lbs weight loss. Pt had a revision of Lap Band with Roux-en-Y GB due to plataeu in weigh which results in a ~ 95 lbs loss.    On his initial visit to our clinic his A1c was 7.6%, was on MDI regimen and Invokana.   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 Continued  lantus ,  Novolog and  Invokana   Today (05/07/2019): Todd Delgado is here for a follow up on diabetes care. He checks his blood sugars 3 times daily, through freestyle libre. The patient has had hypoglycemic episodes since the last clinic visit, which typically occur 2 x / week- most often occuring early morning. The patient is symptomatic with these episodes. Otherwise, the patient has not required any recent emergency interventions for hypoglycemia and has not had recent hospitalizations secondary to hyper or hypoglycemic episodes.    ROS: As per HPI and as detailed below: Review of Systems  Constitutional: Negative for chills and fever.  HENT: Negative for congestion and sore throat.   Respiratory: Negative for cough and  shortness of breath.   Cardiovascular: Negative for chest pain and palpitations.  Gastrointestinal: Negative for diarrhea and nausea.      HOME DIABETES REGIMEN:   Lantus 32 units daily   Novolog to 10 units with each meal , if your pre-meal sugar is 200 or higher add 2 units of novolog to that meal   Invokana 300 mg daily       CONTINUOUS GLUCOSE MONITORING RECORD INTERPRETATION    Dates of Recording: 12/10-12/23/2020  Sensor description:Freestyle Libre  Results statistics:   CGM use % of time 47  Average and SD 143/31.8  Time in range      72 %  % Time Above 180 23  % Time above 250 0  % Time Below target 5    Glycemic patterns summary: hypoglycemia at night between MN-6 AM , occasional hyperglycemia at supper  Hyperglycemic episodes  Post-prandial   Hypoglycemic episodes occurred during the night , and rarely post lunch   Overnight periods: decrease    HISTORY:  Past Medical History:  Past Medical History:  Diagnosis Date  . Chest pain   . CHF (congestive heart failure) (HCC)   . Chronic kidney disease   . Diabetes mellitus without complication (HCC)   . Hyperlipemia   . Hypertension   . Impotence   . Morbid obesity (HCC)   . Post-herpetic polyneuropathy   . Sleep apnea   . Sleep apnea   .  Vitamin D deficiency    Past Surgical History:  Past Surgical History:  Procedure Laterality Date  . BARIATRIC SURGERY  03/2017   Lap band to Roux-en-Y gastric bypass   . LAPAROSCOPIC GASTRIC BANDING  July 2009    Dr. Grier Mitts    Social History:  reports that he has never smoked. He has never used smokeless tobacco. He reports that he does not drink alcohol or use drugs. Family History:  Family History  Problem Relation Age of Onset  . Myasthenia gravis Mother   . Heart disease Father   . Diabetes Father   . Hypertension Father   . Heart failure Father   . Heart disease Other      HOME MEDICATIONS: Allergies as of 05/07/2019      Reactions    Spironolactone Other (See Comments)   gynecomastia      Medication List       Accurate as of May 07, 2019  3:39 PM. If you have any questions, ask your nurse or doctor.        5-HTP 100 MG Caps Take by mouth.   amLODipine 5 MG tablet Commonly known as: NORVASC   atorvastatin 20 MG tablet Commonly known as: LIPITOR Take 1 tablet (20 mg total) by mouth daily.   b complex vitamins tablet Take 1 tablet by mouth daily.   BIOTIN PO Take 75 mg by mouth.   canagliflozin 300 MG Tabs tablet Commonly known as: INVOKANA Take 300 mg by mouth daily before breakfast.   cholecalciferol 1000 units tablet Commonly known as: VITAMIN D Take 2,000 Units by mouth daily.   CINNAMON PO Take 1,000 mg by mouth.   cloNIDine 0.3 mg/24hr patch Commonly known as: CATAPRES - Dosed in mg/24 hr Place 0.3 mg onto the skin once a week.   CRANBERRY PO Take by mouth.   diltiazem 300 MG 24 hr capsule Commonly known as: CARDIZEM CD Take 1 capsule (300 mg total) by mouth daily.   FreeStyle [employer] Misc by Does not apply route.   hydrALAZINE 25 MG tablet Commonly known as: APRESOLINE Take 1 tablet (25 mg total) by mouth 2 (two) times daily.   Lantus SoloStar 100 UNIT/ML Solostar Pen Generic drug: Insulin Glargine Inject 32 Units into the skin daily.   losartan 50 MG tablet Commonly known as: COZAAR Take 1 tablet (50 mg total) by mouth 2 (two) times daily. As directed.   multivitamin with minerals tablet Take 1 tablet by mouth daily.   Nebivolol HCl 20 MG Tabs Commonly known as: Bystolic Take 1 tablet (20 mg total) by mouth daily.   NovoLOG FlexPen 100 UNIT/ML FlexPen Generic drug: insulin aspart INJECT 8 UNITS INTO THE SKIN 15 MINUTES BEFORE MEALS   PROBIOTIC DAILY PO Take by mouth daily.   sildenafil 50 MG tablet Commonly known as: VIAGRA Take 50 mg by mouth daily as needed for erectile dysfunction.   spironolactone 25 MG tablet Commonly known as: ALDACTONE  Take 0.5 tablets (12.5 mg total) by mouth 2 (two) times daily.   tadalafil 20 MG tablet Commonly known as: CIALIS Take 1 tablet (20 mg total) by mouth daily as needed for erectile dysfunction.   UNABLE TO FIND Take by mouth daily. Med Name: MEGA RED  Take as directed.   vitamin C 500 MG tablet Commonly known as: ASCORBIC ACID Take 500 mg by mouth daily.        OBJECTIVE:   Vital Signs: BP (!) 152/92 (BP Location: Right  Arm, Patient Position: Sitting, Cuff Size: Large)   Pulse 70   Temp 98.4 F (36.9 C)   Ht 6' (1.829 m)   Wt (!) 310 lb 6.4 oz (140.8 kg)   SpO2 97%   BMI 42.10 kg/m   Wt Readings from Last 3 Encounters:  05/07/19 (!) 310 lb 6.4 oz (140.8 kg)  05/01/19 298 lb (135.2 kg)  02/04/19 (!) 303 lb 12.8 oz (137.8 kg)     Exam: General: Pt appears well and is in NAD  Lungs: Clear with good BS bilat with no rales, rhonchi, or wheezes  Heart: RRR with normal S1 and S2 and no gallops; no murmurs; no rub  Extremities: 2+ pretibial edema.   Skin: Normal texture and temperature to palpation.   Neuro: MS is good with appropriate affect, pt is alert and Ox3     DM foot exam:05/08/2019  The skin of the feet is intact without sores or ulcerations. The pedal pulses are 2+ on right and 2+ on left. The sensation is intact to a screening 5.07, 10 gram monofilament bilaterally    DATA REVIEWED:  Lab Results  Component Value Date   HGBA1C 6.2 (A) 05/07/2019   HGBA1C 7.6 (A) 12/10/2018   Results for FISHER, HARGADON Delgado "JOE" (MRN [id number]) as of 02/04/2019 16:00  Ref. Range 12/10/2018 16:34  Sodium Latest Ref Range: 135 - 145 mEq/L 139  Potassium Latest Ref Range: 3.5 - 5.1 mEq/L 3.8  Chloride Latest Ref Range: 96 - 112 mEq/L 104  CO2 Latest Ref Range: 19 - 32 mEq/L 28  Glucose Latest Ref Range: 70 - 99 mg/dL 186 (H)  BUN Latest Ref Range: 6 - 23 mg/dL 9  Creatinine Latest Ref Range: 0.40 - 1.50 mg/dL 0.92  Calcium Latest Ref Range: 8.4 - 10.5 mg/dL 9.1   GFR Latest Ref Range: >60.00 mL/min 102.14    ASSESSMENT / PLAN / RECOMMENDATIONS:   1) Type 2 Diabetes Mellitus, Without complications - Most recent A1c of 6.2 %. Goal A1c < 7.0 %.    - Pt continues to do very well with his diabetes control, his low A1c of 6.2% is partly due to hypoglycemic episodes ( 5% on the CGM) ,will adjust insulin as below  - We again discussed Add-On options such as  GLP-1 agonists to see if we can get him off prandial insulin and to help offset the weight gain, but he is happy with the current regimen.     MEDICATIONS:  Decrease Lantus to 26 units daily   Novolog 10 units with Breakfast and Lunch, increase to 12 units with Supper  Invokana 300 mg daily   CF (BG-130/30)   EDUCATION / INSTRUCTIONS:  BG monitoring instructions: Patient is instructed to check his blood sugars 4 times a day, before meals and bedtime .  Call [facility] if: BG persistently < 70 or > 300. . I reviewed the Rule of 15 for the treatment of hypoglycemia in detail with the patient. Literature supplied.       F/U in 4 months   Signed electronically by: Mack Guise, MD  [facility] End[facility]  [facility] [address] [place], [place] [address] Phone: [phone] FAX: [phone]   CC: Chesley Noon, MD [place] [address] Phone: [phone]  Fax: [phone]  Return to Endocrinology clinic as below: Future Appointments  Date Time Provider [place]  05/07/2019  3:40 PM Denyse Fillion, Melanie Crazier, MD [id number]  None

## 2019-05-08 ENCOUNTER — Encounter: Payer: Self-pay | Admitting: Internal Medicine

## 2019-05-08 DIAGNOSIS — E119 Type 2 diabetes mellitus without complications: Secondary | ICD-10-CM | POA: Insufficient documentation

## 2019-06-02 DIAGNOSIS — K912 Postsurgical malabsorption, not elsewhere classified: Secondary | ICD-10-CM | POA: Diagnosis not present

## 2019-06-02 DIAGNOSIS — E119 Type 2 diabetes mellitus without complications: Secondary | ICD-10-CM | POA: Diagnosis not present

## 2019-06-02 DIAGNOSIS — Z794 Long term (current) use of insulin: Secondary | ICD-10-CM | POA: Diagnosis not present

## 2019-06-02 DIAGNOSIS — Z9884 Bariatric surgery status: Secondary | ICD-10-CM | POA: Diagnosis not present

## 2019-06-09 DIAGNOSIS — Z0000 Encounter for general adult medical examination without abnormal findings: Secondary | ICD-10-CM | POA: Diagnosis not present

## 2019-06-09 DIAGNOSIS — E1169 Type 2 diabetes mellitus with other specified complication: Secondary | ICD-10-CM | POA: Diagnosis not present

## 2019-06-09 DIAGNOSIS — E1142 Type 2 diabetes mellitus with diabetic polyneuropathy: Secondary | ICD-10-CM | POA: Diagnosis not present

## 2019-06-09 DIAGNOSIS — I10 Essential (primary) hypertension: Secondary | ICD-10-CM | POA: Diagnosis not present

## 2019-07-08 DIAGNOSIS — E1142 Type 2 diabetes mellitus with diabetic polyneuropathy: Secondary | ICD-10-CM | POA: Diagnosis not present

## 2019-07-08 DIAGNOSIS — I10 Essential (primary) hypertension: Secondary | ICD-10-CM | POA: Diagnosis not present

## 2019-07-08 DIAGNOSIS — Z13228 Encounter for screening for other metabolic disorders: Secondary | ICD-10-CM | POA: Diagnosis not present

## 2019-07-08 DIAGNOSIS — Z6841 Body Mass Index (BMI) 40.0 and over, adult: Secondary | ICD-10-CM | POA: Diagnosis not present

## 2019-07-28 DIAGNOSIS — I10 Essential (primary) hypertension: Secondary | ICD-10-CM | POA: Diagnosis not present

## 2019-07-28 DIAGNOSIS — Z713 Dietary counseling and surveillance: Secondary | ICD-10-CM | POA: Diagnosis not present

## 2019-07-28 DIAGNOSIS — Z6841 Body Mass Index (BMI) 40.0 and over, adult: Secondary | ICD-10-CM | POA: Diagnosis not present

## 2019-07-28 DIAGNOSIS — G4733 Obstructive sleep apnea (adult) (pediatric): Secondary | ICD-10-CM | POA: Diagnosis not present

## 2019-08-04 DIAGNOSIS — Z713 Dietary counseling and surveillance: Secondary | ICD-10-CM | POA: Diagnosis not present

## 2019-08-04 DIAGNOSIS — M79675 Pain in left toe(s): Secondary | ICD-10-CM | POA: Diagnosis not present

## 2019-08-04 DIAGNOSIS — I10 Essential (primary) hypertension: Secondary | ICD-10-CM | POA: Diagnosis not present

## 2019-08-04 DIAGNOSIS — M7989 Other specified soft tissue disorders: Secondary | ICD-10-CM | POA: Diagnosis not present

## 2019-08-12 DIAGNOSIS — R52 Pain, unspecified: Secondary | ICD-10-CM | POA: Diagnosis not present

## 2019-08-12 DIAGNOSIS — I10 Essential (primary) hypertension: Secondary | ICD-10-CM | POA: Diagnosis not present

## 2019-08-12 DIAGNOSIS — S93515A Sprain of interphalangeal joint of left lesser toe(s), initial encounter: Secondary | ICD-10-CM | POA: Diagnosis not present

## 2019-08-18 DIAGNOSIS — Z6841 Body Mass Index (BMI) 40.0 and over, adult: Secondary | ICD-10-CM | POA: Diagnosis not present

## 2019-08-18 DIAGNOSIS — S93491A Sprain of other ligament of right ankle, initial encounter: Secondary | ICD-10-CM | POA: Diagnosis not present

## 2019-08-18 DIAGNOSIS — M79671 Pain in right foot: Secondary | ICD-10-CM | POA: Diagnosis not present

## 2019-08-18 DIAGNOSIS — Z713 Dietary counseling and surveillance: Secondary | ICD-10-CM | POA: Diagnosis not present

## 2019-08-18 DIAGNOSIS — R52 Pain, unspecified: Secondary | ICD-10-CM | POA: Diagnosis not present

## 2019-08-18 DIAGNOSIS — I10 Essential (primary) hypertension: Secondary | ICD-10-CM | POA: Diagnosis not present

## 2019-08-18 DIAGNOSIS — S93401S Sprain of unspecified ligament of right ankle, sequela: Secondary | ICD-10-CM | POA: Diagnosis not present

## 2019-08-18 DIAGNOSIS — R600 Localized edema: Secondary | ICD-10-CM | POA: Diagnosis not present

## 2019-08-31 DIAGNOSIS — R52 Pain, unspecified: Secondary | ICD-10-CM | POA: Diagnosis not present

## 2019-08-31 DIAGNOSIS — L299 Pruritus, unspecified: Secondary | ICD-10-CM | POA: Diagnosis not present

## 2019-09-04 DIAGNOSIS — I10 Essential (primary) hypertension: Secondary | ICD-10-CM | POA: Diagnosis not present

## 2019-09-04 DIAGNOSIS — W57XXXA Bitten or stung by nonvenomous insect and other nonvenomous arthropods, initial encounter: Secondary | ICD-10-CM | POA: Diagnosis not present

## 2019-09-04 DIAGNOSIS — Z23 Encounter for immunization: Secondary | ICD-10-CM | POA: Diagnosis not present

## 2019-09-04 DIAGNOSIS — M791 Myalgia, unspecified site: Secondary | ICD-10-CM | POA: Diagnosis not present

## 2019-09-04 DIAGNOSIS — S60561A Insect bite (nonvenomous) of right hand, initial encounter: Secondary | ICD-10-CM | POA: Diagnosis not present

## 2019-09-04 DIAGNOSIS — S60562A Insect bite (nonvenomous) of left hand, initial encounter: Secondary | ICD-10-CM | POA: Diagnosis not present

## 2019-09-08 ENCOUNTER — Encounter: Payer: Self-pay | Admitting: Internal Medicine

## 2019-09-08 ENCOUNTER — Other Ambulatory Visit: Payer: Self-pay

## 2019-09-08 ENCOUNTER — Ambulatory Visit: Payer: BC Managed Care – PPO | Admitting: Internal Medicine

## 2019-09-08 VITALS — BP 148/86 | HR 69 | Temp 98.4°F | Ht 72.0 in | Wt 312.6 lb

## 2019-09-08 DIAGNOSIS — Z713 Dietary counseling and surveillance: Secondary | ICD-10-CM | POA: Diagnosis not present

## 2019-09-08 DIAGNOSIS — E1165 Type 2 diabetes mellitus with hyperglycemia: Secondary | ICD-10-CM

## 2019-09-08 DIAGNOSIS — E119 Type 2 diabetes mellitus without complications: Secondary | ICD-10-CM

## 2019-09-08 DIAGNOSIS — Z794 Long term (current) use of insulin: Secondary | ICD-10-CM

## 2019-09-08 DIAGNOSIS — I10 Essential (primary) hypertension: Secondary | ICD-10-CM | POA: Diagnosis not present

## 2019-09-08 DIAGNOSIS — Z6841 Body Mass Index (BMI) 40.0 and over, adult: Secondary | ICD-10-CM | POA: Diagnosis not present

## 2019-09-08 LAB — LIPID PANEL
Cholesterol: 130 mg/dL (ref 0–200)
HDL: 47.7 mg/dL (ref >39.00)
LDL Cholesterol: 54 mg/dL (ref 0–99)
NonHDL: 82.25
Total CHOL/HDL Ratio: 3
Triglycerides: 139 mg/dL (ref 0.0–149.0)
VLDL: 27.8 mg/dL (ref 0.0–40.0)

## 2019-09-08 LAB — POCT GLYCOSYLATED HEMOGLOBIN (HGB A1C): Hemoglobin A1C: 6.6 % — AB (ref 4.0–5.6)

## 2019-09-08 LAB — MICROALBUMIN / CREATININE URINE RATIO
Creatinine,U: 70 mg/dL
Microalb Creat Ratio: 2.5 mg/g (ref 0.0–30.0)
Microalb, Ur: 1.8 mg/dL (ref 0.0–1.9)

## 2019-09-08 LAB — BASIC METABOLIC PANEL
BUN: 23 mg/dL (ref 6–23)
CO2: 28 mEq/L (ref 19–32)
Calcium: 9.4 mg/dL (ref 8.4–10.5)
Chloride: 106 mEq/L (ref 96–112)
Creatinine, Ser: 1.07 mg/dL (ref 0.40–1.50)
GFR: 85.58 mL/min (ref >60.00)
Glucose, Bld: 170 mg/dL — ABNORMAL HIGH (ref 70–99)
Potassium: 4.5 mEq/L (ref 3.5–5.1)
Sodium: 141 mEq/L (ref 135–145)

## 2019-09-08 NOTE — Patient Instructions (Addendum)
-   Continue Lantus 28 units daily  - Novolog 10 units with each meal  - Continue Invokana 300 mg daily     HOW TO TREAT LOW BLOOD SUGARS (Blood sugar LESS THAN 70 MG/DL)  Please follow the RULE OF 15 for the treatment of hypoglycemia treatment (when your (blood sugars are less than 70 mg/dL)    STEP 1: Take 15 grams of carbohydrates when your blood sugar is low, which includes:   3-4 GLUCOSE TABS  OR  3-4 OZ OF JUICE OR REGULAR SODA OR  ONE TUBE OF GLUCOSE GEL     STEP 2: RECHECK blood sugar in 15 MINUTES STEP 3: If your blood sugar is still low at the 15 minute recheck --> then, go back to STEP 1 and treat AGAIN with another 15 grams of carbohydrates.

## 2019-09-08 NOTE — Progress Notes (Signed)
Name: Todd Delgado  Age/ Sex: 59 y.o., male   MRN/ DOB: [id number], 1961/01/23     PCP: Eartha Inch, MD   Reason for Endocrinology Evaluation: Type 2 Diabetes Mellitus  Initial Endocrine Consultative Visit: 12/10/2018    PATIENT IDENTIFIER: Todd Delgado is a 59 y.o. male with a past medical history of T2DM, S/P Weight loss sx (04/2017), OSA, HTN and dyslipidemia . The patient has followed with Endocrinology clinic since 12/10/2018 for consultative assistance with management of his diabetes.  DIABETIC HISTORY:  Todd Delgado was diagnosed with T2DM in 1998. He has been on oral glycemic agents in the past with some reported intolerance. (Metformin- unclear, Byetta - halitosis and GI side effects ). His hemoglobin A1c has ranged from 7.0 % in 2018, peaking at 9.0%      Weight Loss :  Had a lap band sx  in 2009 and lost ~50 lbs weight loss. Pt had a revision of Lap Band with Roux-en-Y GB due to plataeu in weigh which results in a ~ 95 lbs loss.    On his initial visit to our clinic his A1c was 7.6%, was on MDI regimen and Invokana.   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  We adjusted his MDI regimen and continued Invokana.    Today (09/08/2019): Todd Delgado is here for a follow up on diabetes care. He checks his blood sugars multiple times daily, through freestyle libre. The patient has had hypoglycemic episodes since the last clinic visit, which typically occur 2 x / week- most often occuring early morning. The patient is symptomatic with these episodes. Otherwise, the patient has not required any recent emergency interventions for hypoglycemia and has not had recent hospitalizations secondary to hyper or hypoglycemic episodes.   He is participating with a weight loss program through [employer]   ROS: As per HPI and as detailed below: Review of Systems  Constitutional: Negative for chills and fever.    HENT: Negative for congestion and sore throat.   Respiratory: Negative for cough and shortness of breath.   Cardiovascular: Negative for chest pain and palpitations.  Gastrointestinal: Negative for diarrhea and nausea.      HOME DIABETES REGIMEN:  Lantus 26 units daily - taking 28 units  Novolog 10 units with Breakfast and Lunch, increase to 12 units with Supper- taking 10 units TID  Invokana 300 mg daily       CONTINUOUS GLUCOSE MONITORING RECORD INTERPRETATION    Dates of Recording: 4/13-4/26/2021  Sensor description:Freestyle Libre  Results statistics:   CGM use % of time 52  Average and SD 161/31  Time in range      63%  % Time Above 180 32  % Time above 250 5  % Time Below target 0    Glycemic patterns summary : Hyperglycemia noted around lunch time, worse by evening   Hyperglycemic episodes  Post-prandial   Hypoglycemic episodes occurred : None  Overnight periods: within range     HISTORY:  Past Medical History:  Past Medical History:  Diagnosis Date  . Chest pain   . CHF (congestive heart failure) (HCC)   . Chronic kidney disease   . Diabetes mellitus without complication (HCC)   . Hyperlipemia   . Hypertension   . Impotence   . Morbid obesity (HCC)   . Post-herpetic polyneuropathy   . Sleep apnea   . Sleep apnea   .  Vitamin D deficiency    Past Surgical History:  Past Surgical History:  Procedure Laterality Date  . BARIATRIC SURGERY  03/2017   Lap band to Roux-en-Y gastric bypass   . LAPAROSCOPIC GASTRIC BANDING  July 2009    Dr. Effie Shy    Social History:  reports that he has never smoked. He has never used smokeless tobacco. He reports that he does not drink alcohol or use drugs. Family History:  Family History  Problem Relation Age of Onset  . Myasthenia gravis Mother   . Heart disease Father   . Diabetes Father   . Hypertension Father   . Heart failure Father   . Heart disease Other      HOME MEDICATIONS: Allergies as of  09/08/2019      Reactions   Spironolactone Other (See Comments)   gynecomastia      Medication List       Accurate as of September 08, 2019  3:06 PM. If you have any questions, ask your nurse or doctor.        5-HTP 100 MG Caps Take by mouth.   amLODipine 5 MG tablet Commonly known as: NORVASC   atorvastatin 20 MG tablet Commonly known as: LIPITOR Take 1 tablet (20 mg total) by mouth daily.   b complex vitamins tablet Take 1 tablet by mouth daily.   BIOTIN PO Take 75 mg by mouth.   canagliflozin 300 MG Tabs tablet Commonly known as: INVOKANA Take 300 mg by mouth daily before breakfast.   cholecalciferol 1000 units tablet Commonly known as: VITAMIN D Take 2,000 Units by mouth daily.   CINNAMON PO Take 1,000 mg by mouth.   cloNIDine 0.3 mg/24hr patch Commonly known as: CATAPRES - Dosed in mg/24 hr Place 0.3 mg onto the skin once a week.   CRANBERRY PO Take by mouth.   diltiazem 300 MG 24 hr capsule Commonly known as: CARDIZEM CD Take 1 capsule (300 mg total) by mouth daily.   FreeStyle [employer] Misc by Does not apply route.   hydrALAZINE 25 MG tablet Commonly known as: APRESOLINE Take 1 tablet (25 mg total) by mouth 2 (two) times daily.   Lantus SoloStar 100 UNIT/ML Solostar Pen Generic drug: insulin glargine Inject 32 Units into the skin daily. What changed: how much to take   losartan 50 MG tablet Commonly known as: COZAAR Take 1 tablet (50 mg total) by mouth 2 (two) times daily. As directed.   multivitamin with minerals tablet Take 1 tablet by mouth daily.   Nebivolol HCl 20 MG Tabs Commonly known as: Bystolic Take 1 tablet (20 mg total) by mouth daily.   NovoLOG FlexPen 100 UNIT/ML FlexPen Generic drug: insulin aspart 10 Units.   PROBIOTIC DAILY PO Take by mouth daily.   sildenafil 50 MG tablet Commonly known as: VIAGRA Take 50 mg by mouth daily as needed for erectile dysfunction.   spironolactone 25 MG tablet Commonly  known as: ALDACTONE Take 0.5 tablets (12.5 mg total) by mouth 2 (two) times daily.   tadalafil 20 MG tablet Commonly known as: CIALIS Take 1 tablet (20 mg total) by mouth daily as needed for erectile dysfunction.   UNABLE TO FIND Take by mouth daily. Med Name: MEGA RED  Take as directed.   vitamin C 500 MG tablet Commonly known as: ASCORBIC ACID Take 500 mg by mouth daily.        OBJECTIVE:   Vital Signs: BP (!) 148/86 (BP Location: Left Arm, Patient  Position: Sitting, Cuff Size: Large)   Pulse 69   Temp 98.4 F (36.9 C)   Ht 6' (1.829 m)   Wt (!) 312 lb 9.6 oz (141.8 kg)   SpO2 96%   BMI 42.40 kg/m   Wt Readings from Last 3 Encounters:  09/08/19 (!) 312 lb 9.6 oz (141.8 kg)  05/07/19 (!) 310 lb 6.4 oz (140.8 kg)  05/01/19 298 lb (135.2 kg)     Exam: General: Pt appears well and is in NAD  Lungs: Clear with good BS bilat with no rales, rhonchi, or wheezes  Heart: RRR with normal S1 and S2 and no gallops; no murmurs; no rub  Extremities: 1+ pretibial edema.   Skin: Normal texture and temperature to palpation.   Neuro: MS is good with appropriate affect, pt is alert and Ox3     DM foot exam:05/08/2019  The skin of the feet is intact without sores or ulcerations. The pedal pulses are 2+ on right and 2+ on left. The sensation is intact to a screening 5.07, 10 gram monofilament bilaterally    DATA REVIEWED:  Lab Results  Component Value Date   HGBA1C 6.6 (A) 09/08/2019   HGBA1C 6.2 (A) 05/07/2019   HGBA1C 7.6 (A) 12/10/2018   Results for Todd Delgado, Todd Delgado "JOE" (MRN [id number]) as of 02/04/2019 16:00  Ref. Range 12/10/2018 16:34  Sodium Latest Ref Range: 135 - 145 mEq/L 139  Potassium Latest Ref Range: 3.5 - 5.1 mEq/L 3.8  Chloride Latest Ref Range: 96 - 112 mEq/L 104  CO2 Latest Ref Range: 19 - 32 mEq/L 28  Glucose Latest Ref Range: 70 - 99 mg/dL 962 (H)  BUN Latest Ref Range: 6 - 23 mg/dL 9  Creatinine Latest Ref Range: 0.40 - 1.50 mg/dL 9.52    Calcium Latest Ref Range: 8.4 - 10.5 mg/dL 9.1  GFR Latest Ref Range: >60.00 mL/min 102.14    ASSESSMENT / PLAN / RECOMMENDATIONS:   1) Type 2 Diabetes Mellitus, Without complications - Most recent A1c of 6.6 %. Goal A1c < 7.0 %.    -A1c continues to be at goal without hypoglycemia - We again discussed Add-On options such as  GLP-1 agonists to see if we can get him off prandial insulin and to help offset the weight gain, but he is happy with the current regimen.  - We discussed the importance of taking prandial insulin with each meal, I went over with the patient various strategies as to how he can be reminded to take the prandial insulin     MEDICATIONS:  Continue Lantus 28 units daily   Novolog 10 units TIDQAC  Invokana 300 mg daily     EDUCATION / INSTRUCTIONS:  BG monitoring instructions: Patient is instructed to check his blood sugars 4 times a day, before meals and bedtime .  Call [facility] if: BG persistently < 70 or > 300. . I reviewed the Rule of 15 for the treatment of hypoglycemia in detail with the patient. Literature supplied.       F/U in 4 months   Signed electronically by: Lyndle Herrlich, MD  [facility]  [facility] [address] [place], [place] [address] Phone: [phone] FAX: [phone]   CC: Eartha Inch, MD [address] [place] [address] Phone: [phone]  Fax: [phone]  Return to Endocrinology clinic as below: Future Appointments  Date Time Provider Department Center  09/08/2019  3:20 PM Hieu Herms, Konrad Dolores, MD  [id number] None

## 2019-09-29 DIAGNOSIS — I10 Essential (primary) hypertension: Secondary | ICD-10-CM | POA: Diagnosis not present

## 2019-09-29 DIAGNOSIS — Z6841 Body Mass Index (BMI) 40.0 and over, adult: Secondary | ICD-10-CM | POA: Diagnosis not present

## 2019-09-29 DIAGNOSIS — Z794 Long term (current) use of insulin: Secondary | ICD-10-CM | POA: Diagnosis not present

## 2019-09-29 DIAGNOSIS — Z713 Dietary counseling and surveillance: Secondary | ICD-10-CM | POA: Diagnosis not present

## 2019-09-29 DIAGNOSIS — E119 Type 2 diabetes mellitus without complications: Secondary | ICD-10-CM | POA: Diagnosis not present

## 2019-10-02 DIAGNOSIS — Z794 Long term (current) use of insulin: Secondary | ICD-10-CM | POA: Diagnosis not present

## 2019-10-02 DIAGNOSIS — Z6841 Body Mass Index (BMI) 40.0 and over, adult: Secondary | ICD-10-CM | POA: Diagnosis not present

## 2019-10-02 DIAGNOSIS — E119 Type 2 diabetes mellitus without complications: Secondary | ICD-10-CM | POA: Diagnosis not present

## 2019-10-27 DIAGNOSIS — Z712 Person consulting for explanation of examination or test findings: Secondary | ICD-10-CM | POA: Diagnosis not present

## 2019-10-27 DIAGNOSIS — Z713 Dietary counseling and surveillance: Secondary | ICD-10-CM | POA: Diagnosis not present

## 2019-10-27 DIAGNOSIS — Z6841 Body Mass Index (BMI) 40.0 and over, adult: Secondary | ICD-10-CM | POA: Diagnosis not present

## 2019-10-27 DIAGNOSIS — I10 Essential (primary) hypertension: Secondary | ICD-10-CM | POA: Diagnosis not present

## 2019-12-31 ENCOUNTER — Other Ambulatory Visit: Payer: Self-pay | Admitting: Cardiovascular Disease

## 2020-01-05 ENCOUNTER — Other Ambulatory Visit: Payer: Self-pay | Admitting: Internal Medicine

## 2020-01-06 DIAGNOSIS — E1142 Type 2 diabetes mellitus with diabetic polyneuropathy: Secondary | ICD-10-CM | POA: Diagnosis not present

## 2020-01-06 DIAGNOSIS — I10 Essential (primary) hypertension: Secondary | ICD-10-CM | POA: Diagnosis not present

## 2020-01-06 DIAGNOSIS — G4733 Obstructive sleep apnea (adult) (pediatric): Secondary | ICD-10-CM | POA: Diagnosis not present

## 2020-01-06 DIAGNOSIS — E1169 Type 2 diabetes mellitus with other specified complication: Secondary | ICD-10-CM | POA: Diagnosis not present

## 2020-01-06 DIAGNOSIS — Z794 Long term (current) use of insulin: Secondary | ICD-10-CM | POA: Diagnosis not present

## 2020-01-08 DIAGNOSIS — E1169 Type 2 diabetes mellitus with other specified complication: Secondary | ICD-10-CM | POA: Diagnosis not present

## 2020-01-08 DIAGNOSIS — E119 Type 2 diabetes mellitus without complications: Secondary | ICD-10-CM | POA: Diagnosis not present

## 2020-01-08 DIAGNOSIS — Z794 Long term (current) use of insulin: Secondary | ICD-10-CM | POA: Diagnosis not present

## 2020-01-08 DIAGNOSIS — I10 Essential (primary) hypertension: Secondary | ICD-10-CM | POA: Diagnosis not present

## 2020-01-08 DIAGNOSIS — E785 Hyperlipidemia, unspecified: Secondary | ICD-10-CM | POA: Diagnosis not present

## 2020-01-11 NOTE — Progress Notes (Signed)
Name: Todd Delgado  Age/ Sex: 59 y.o., male   MRN/ DOB: [id number], January 18, 1961     PCP: Eartha Inch, MD   Reason for Endocrinology Evaluation: Type 2 Diabetes Mellitus  Initial Endocrine Consultative Visit: 12/10/2018    PATIENT IDENTIFIER: Todd Delgado is a 59 y.o. male with a past medical history of T2DM, S/P Weight loss sx (04/2017), OSA, HTN and dyslipidemia . The patient has followed with Endocrinology clinic since 12/10/2018 for consultative assistance with management of his diabetes.  DIABETIC HISTORY:  Todd Delgado was diagnosed with T2DM in 1998. He has been on oral glycemic agents in the past with some reported intolerance. (Metformin- unclear, Byetta - halitosis and GI side effects ). His hemoglobin A1c has ranged from 7.0 % in 2018, peaking at 9.0%      Weight Loss :  Had a lap band sx  in 2009 and lost ~50 lbs weight loss. Pt had a revision of Lap Band with Roux-en-Y GB due to plataeu in weigh which results in a ~ 95 lbs loss.    On his initial visit to our clinic his A1c was 7.6%, was on MDI regimen and Invokana.   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  We continued MDI  regimen and  Invokana.    Today (01/12/2020): Todd Delgado is here for a follow up on diabetes care. He checks his blood sugars multiple times daily, through freestyle libre. The patient has had hypoglycemic episodes since the last clinic visit, which typically occur 2 x / week- most often occuring early morning. The patient is symptomatic with these episodes. Otherwise, the patient has not required any recent emergency interventions for hypoglycemia and has not had recent hospitalizations secondary to hyper or hypoglycemic episodes.   He is participating with a weight loss program through [employer]    HOME DIABETES REGIMEN:  Lantus 28 units daily Novolog 10 units with meals  Invokana 300 mg daily -  out for the past  week     CONTINUOUS GLUCOSE MONITORING RECORD INTERPRETATION    Dates of Recording: 8/17-8/30/2021  Sensor description:Freestyle Libre  Results statistics:   CGM use % of time 59  Average and SD 166/34.7  Time in range      61%  % Time Above 180 27  % Time above 250 10  % Time Below target 1    Glycemic patterns summary : Hyperglycemia noted postprandial   Hyperglycemic episodes  Post-prandial   Hypoglycemic episodes occurred : Rare after coorection Overnight periods: within range     HISTORY:  Past Medical History:  Past Medical History:  Diagnosis Date  . Chest pain   . CHF (congestive heart failure) (HCC)   . Chronic kidney disease   . Diabetes mellitus without complication (HCC)   . Hyperlipemia   . Hypertension   . Impotence   . Morbid obesity (HCC)   . Post-herpetic polyneuropathy   . Sleep apnea   . Sleep apnea   . Vitamin D deficiency    Past Surgical History:  Past Surgical History:  Procedure Laterality Date  . BARIATRIC SURGERY  03/2017   Lap band to Roux-en-Y gastric bypass   . LAPAROSCOPIC GASTRIC BANDING  July 2009    Dr. Grier Mitts    Social History:  reports that he has never smoked. He has never used smokeless tobacco. He reports that he does not drink alcohol  and does not use drugs. Family History:  Family History  Problem Relation Age of Onset  . Myasthenia gravis Mother   . Heart disease Father   . Diabetes Father   . Hypertension Father   . Heart failure Father   . Heart disease Other      HOME MEDICATIONS: Allergies as of 01/12/2020      Reactions   Spironolactone Other (See Comments)   gynecomastia      Medication List       Accurate as of January 12, 2020  3:41 PM. If you have any questions, ask your nurse or doctor.        5-HTP 100 MG Caps Take by mouth.   amLODipine 5 MG tablet Commonly known as: NORVASC   atorvastatin 20 MG tablet Commonly known as: LIPITOR Take 1 tablet (20 mg total) by mouth daily.    b complex vitamins tablet Take 1 tablet by mouth daily.   BIOTIN PO Take 75 mg by mouth.   canagliflozin 300 MG Tabs tablet Commonly known as: INVOKANA Take 300 mg by mouth daily before breakfast.   cholecalciferol 1000 units tablet Commonly known as: VITAMIN D Take 2,000 Units by mouth daily.   CINNAMON PO Take 1,000 mg by mouth.   cloNIDine 0.3 mg/24hr patch Commonly known as: CATAPRES - Dosed in mg/24 hr Place 0.3 mg onto the skin once a week.   CRANBERRY PO Take by mouth.   diltiazem 300 MG 24 hr capsule Commonly known as: CARDIZEM CD Take 1 capsule (300 mg total) by mouth daily.   FreeStyle [employer] Misc by Does not apply route.   hydrALAZINE 25 MG tablet Commonly known as: APRESOLINE Take 1 tablet (25 mg total) by mouth 2 (two) times daily.   Lantus SoloStar 100 UNIT/ML Solostar Pen Generic drug: insulin glargine Inject 28 units under the skin once daily.   losartan 50 MG tablet Commonly known as: COZAAR TAKE 1 TABLET(50 MG) BY MOUTH TWICE DAILY AS DIRECTED   multivitamin with minerals tablet Take 1 tablet by mouth daily.   Nebivolol HCl 20 MG Tabs Commonly known as: Bystolic Take 1 tablet (20 mg total) by mouth daily.   NovoLOG FlexPen 100 UNIT/ML FlexPen Generic drug: insulin aspart 10 Units.   PROBIOTIC DAILY PO Take by mouth daily.   sildenafil 50 MG tablet Commonly known as: VIAGRA Take 50 mg by mouth daily as needed for erectile dysfunction.   spironolactone 25 MG tablet Commonly known as: ALDACTONE Take 0.5 tablets (12.5 mg total) by mouth 2 (two) times daily.   tadalafil 20 MG tablet Commonly known as: CIALIS Take 1 tablet (20 mg total) by mouth daily as needed for erectile dysfunction.   UNABLE TO FIND Take by mouth daily. Med Name: MEGA RED  Take as directed.   vitamin C 500 MG tablet Commonly known as: ASCORBIC ACID Take 500 mg by mouth daily.        OBJECTIVE:   Vital Signs: BP (!) 158/94 (BP Location:  Left Arm, Patient Position: Sitting)   Pulse 69   Ht 6' 0.01" (1.829 m)   Wt (!) 324 lb 3.2 oz (147.1 kg)   SpO2 97%   BMI 43.96 kg/m   Wt Readings from Last 3 Encounters:  01/12/20 (!) 324 lb 3.2 oz (147.1 kg)  09/08/19 (!) 312 lb 9.6 oz (141.8 kg)  05/07/19 (!) 310 lb 6.4 oz (140.8 kg)     Exam: General: Pt appears well and is in  NAD  Lungs: Clear with good BS bilat with no rales, rhonchi, or wheezes  Heart: RRR with normal S1 and S2 and no gallops; no murmurs; no rub  Extremities: 1+ pretibial edema.   Skin: Normal texture and temperature to palpation.   Neuro: MS is good with appropriate affect, pt is alert and Ox3     DM foot exam:01/12/2020 The skin of the feet is intact without sores or ulcerations. The pedal pulses are 2+ on right and 2+ on left. The sensation is intact to a screening 5.07, 10 gram monofilament bilaterally    DATA REVIEWED:  Lab Results  Component Value Date   HGBA1C 6.9 (A) 01/12/2020   HGBA1C 6.9 01/12/2020   HGBA1C 6.9 (A) 01/12/2020   HGBA1C 6.9 01/12/2020    Labs reviewed from care everywhere 01/08/2020  ASSESSMENT / PLAN / RECOMMENDATIONS:   1) Type 2 Diabetes Mellitus, Without complications - Most recent A1c of 6.9 %. Goal A1c < 7.0 %.    - Pt admits to lifestyle changes in the past few months due to personal issues requiring travel. He is back home now and is back to his routine.   - He had declined add-on therapy in the past  - Will increase Novolg as below - He will be provided with a new correction scale    MEDICATIONS:  Continue Lantus 28 units daily   Increase Novolog to 12 units TIDQAC  Continue Invokana 300 mg daily  CF: Novolog ( BG -130/25)     EDUCATION / INSTRUCTIONS:  BG monitoring instructions: Patient is instructed to check his blood sugars 4 times a day, before meals and bedtime .  Call  Endocrinology clinic if: BG persistently < 70  . I reviewed the Rule of 15 for the treatment of hypoglycemia  in detail with the patient. Literature supplied.       F/U in 6 months   Signed electronically by: Lyndle Herrlich, MD  [facility] End[facility]inology  [facility] [address] [place], [place] [address] Phone: [phone] FAX: [phone]   CC: Eartha Inch, MD [address] [place] [address] Phone: [phone]  Fax: [phone]  Return to Endocrinology clinic as below: No future appointments.

## 2020-01-12 ENCOUNTER — Other Ambulatory Visit: Payer: Self-pay

## 2020-01-12 ENCOUNTER — Ambulatory Visit: Payer: BC Managed Care – PPO | Admitting: Internal Medicine

## 2020-01-12 VITALS — BP 158/94 | HR 69 | Ht 72.01 in | Wt 324.2 lb

## 2020-01-12 DIAGNOSIS — E1165 Type 2 diabetes mellitus with hyperglycemia: Secondary | ICD-10-CM | POA: Diagnosis not present

## 2020-01-12 DIAGNOSIS — E119 Type 2 diabetes mellitus without complications: Secondary | ICD-10-CM | POA: Diagnosis not present

## 2020-01-12 DIAGNOSIS — Z794 Long term (current) use of insulin: Secondary | ICD-10-CM | POA: Diagnosis not present

## 2020-01-12 LAB — POCT GLYCOSYLATED HEMOGLOBIN (HGB A1C): Hemoglobin A1C: 6.9 % — AB (ref 4.0–5.6)

## 2020-01-12 MED ORDER — DAPAGLIFLOZIN PROPANEDIOL 10 MG PO TABS
10.0000 mg | ORAL_TABLET | Freq: Every day | ORAL | 6 refills | Status: DC
Start: 2020-01-12 — End: 2020-07-19

## 2020-01-12 NOTE — Patient Instructions (Addendum)
-   Continue Lantus 28 units daily  - Increase Novolog 12 units with each meal  - Start  Farxiga 10mg  daily   - Novolog correctional insulin: ADD extra units on insulin to your meal-time Novolog  dose if your blood sugars are higher . Use the scale below to help guide you:   Blood sugar before meal Number of units to inject  Less than 155 0 unit  156-  180 1 units  181 -  205 2 units  206 -  230 3 units  231 -  255 4 units  256 -  280 5 units  281 -  305 6 units  306-  330 7 units          HOW TO TREAT LOW BLOOD SUGARS (Blood sugar LESS THAN 70 MG/DL)  Please follow the RULE OF 15 for the treatment of hypoglycemia treatment (when your (blood sugars are less than 70 mg/dL)    STEP 1: Take 15 grams of carbohydrates when your blood sugar is low, which includes:   3-4 GLUCOSE TABS  OR  3-4 OZ OF JUICE OR REGULAR SODA OR  ONE TUBE OF GLUCOSE GEL     STEP 2: RECHECK blood sugar in 15 MINUTES STEP 3: If your blood sugar is still low at the 15 minute recheck --> then, go back to STEP 1 and treat AGAIN with another 15 grams of carbohydrates.

## 2020-01-13 MED ORDER — LANTUS SOLOSTAR 100 UNIT/ML ~~LOC~~ SOPN: 28.0000 [IU] | PEN_INJECTOR | Freq: Every day | SUBCUTANEOUS | 3 refills | Status: DC

## 2020-01-14 DIAGNOSIS — Z23 Encounter for immunization: Secondary | ICD-10-CM | POA: Diagnosis not present

## 2020-02-09 DIAGNOSIS — E119 Type 2 diabetes mellitus without complications: Secondary | ICD-10-CM | POA: Diagnosis not present

## 2020-02-09 LAB — HM DIABETES EYE EXAM

## 2020-02-16 DIAGNOSIS — B351 Tinea unguium: Secondary | ICD-10-CM | POA: Diagnosis not present

## 2020-02-16 DIAGNOSIS — M2141 Flat foot [pes planus] (acquired), right foot: Secondary | ICD-10-CM | POA: Diagnosis not present

## 2020-02-16 DIAGNOSIS — E0840 Diabetes mellitus due to underlying condition with diabetic neuropathy, unspecified: Secondary | ICD-10-CM | POA: Diagnosis not present

## 2020-04-02 DIAGNOSIS — I10 Essential (primary) hypertension: Secondary | ICD-10-CM | POA: Diagnosis not present

## 2020-04-02 DIAGNOSIS — K912 Postsurgical malabsorption, not elsewhere classified: Secondary | ICD-10-CM | POA: Diagnosis not present

## 2020-04-02 DIAGNOSIS — Z9884 Bariatric surgery status: Secondary | ICD-10-CM | POA: Diagnosis not present

## 2020-04-26 DIAGNOSIS — G4733 Obstructive sleep apnea (adult) (pediatric): Secondary | ICD-10-CM | POA: Diagnosis not present

## 2020-04-27 DIAGNOSIS — E1142 Type 2 diabetes mellitus with diabetic polyneuropathy: Secondary | ICD-10-CM | POA: Diagnosis not present

## 2020-04-27 DIAGNOSIS — Z9884 Bariatric surgery status: Secondary | ICD-10-CM | POA: Diagnosis not present

## 2020-04-27 DIAGNOSIS — E1169 Type 2 diabetes mellitus with other specified complication: Secondary | ICD-10-CM | POA: Diagnosis not present

## 2020-04-27 DIAGNOSIS — Z713 Dietary counseling and surveillance: Secondary | ICD-10-CM | POA: Diagnosis not present

## 2020-05-24 DIAGNOSIS — F54 Psychological and behavioral factors associated with disorders or diseases classified elsewhere: Secondary | ICD-10-CM | POA: Diagnosis not present

## 2020-05-24 DIAGNOSIS — M2142 Flat foot [pes planus] (acquired), left foot: Secondary | ICD-10-CM | POA: Diagnosis not present

## 2020-05-24 DIAGNOSIS — F39 Unspecified mood [affective] disorder: Secondary | ICD-10-CM | POA: Diagnosis not present

## 2020-05-24 DIAGNOSIS — E0840 Diabetes mellitus due to underlying condition with diabetic neuropathy, unspecified: Secondary | ICD-10-CM | POA: Diagnosis not present

## 2020-05-24 DIAGNOSIS — B351 Tinea unguium: Secondary | ICD-10-CM | POA: Diagnosis not present

## 2020-05-24 DIAGNOSIS — M2141 Flat foot [pes planus] (acquired), right foot: Secondary | ICD-10-CM | POA: Diagnosis not present

## 2020-07-12 DIAGNOSIS — Z0000 Encounter for general adult medical examination without abnormal findings: Secondary | ICD-10-CM | POA: Diagnosis not present

## 2020-07-12 DIAGNOSIS — E1169 Type 2 diabetes mellitus with other specified complication: Secondary | ICD-10-CM | POA: Diagnosis not present

## 2020-07-12 DIAGNOSIS — I10 Essential (primary) hypertension: Secondary | ICD-10-CM | POA: Diagnosis not present

## 2020-07-12 DIAGNOSIS — E1142 Type 2 diabetes mellitus with diabetic polyneuropathy: Secondary | ICD-10-CM | POA: Diagnosis not present

## 2020-07-19 ENCOUNTER — Encounter: Payer: Self-pay | Admitting: Internal Medicine

## 2020-07-19 ENCOUNTER — Other Ambulatory Visit: Payer: Self-pay

## 2020-07-19 ENCOUNTER — Ambulatory Visit: Payer: BC Managed Care – PPO | Admitting: Internal Medicine

## 2020-07-19 VITALS — BP 130/82 | HR 66 | Ht 72.0 in | Wt 315.2 lb

## 2020-07-19 DIAGNOSIS — E1165 Type 2 diabetes mellitus with hyperglycemia: Secondary | ICD-10-CM | POA: Diagnosis not present

## 2020-07-19 DIAGNOSIS — Z794 Long term (current) use of insulin: Secondary | ICD-10-CM

## 2020-07-19 DIAGNOSIS — B351 Tinea unguium: Secondary | ICD-10-CM | POA: Diagnosis not present

## 2020-07-19 DIAGNOSIS — E113299 Type 2 diabetes mellitus with mild nonproliferative diabetic retinopathy without macular edema, unspecified eye: Secondary | ICD-10-CM | POA: Diagnosis not present

## 2020-07-19 DIAGNOSIS — E0840 Diabetes mellitus due to underlying condition with diabetic neuropathy, unspecified: Secondary | ICD-10-CM | POA: Diagnosis not present

## 2020-07-19 LAB — POCT GLYCOSYLATED HEMOGLOBIN (HGB A1C): Hemoglobin A1C: 6.4 % — AB (ref 4.0–5.6)

## 2020-07-19 MED ORDER — INSULIN PEN NEEDLE 31G X 5 MM MISC: 1.0000 | Freq: Four times a day (QID) | 3 refills | Status: DC

## 2020-07-19 MED ORDER — NOVOLOG FLEXPEN 100 UNIT/ML ~~LOC~~ SOPN: PEN_INJECTOR | SUBCUTANEOUS | 3 refills | Status: DC

## 2020-07-19 MED ORDER — LANTUS SOLOSTAR 100 UNIT/ML ~~LOC~~ SOPN: 24.0000 [IU] | PEN_INJECTOR | Freq: Every day | SUBCUTANEOUS | 3 refills | Status: DC

## 2020-07-19 MED ORDER — DAPAGLIFLOZIN PROPANEDIOL 10 MG PO TABS: 10.0000 mg | ORAL_TABLET | Freq: Every day | ORAL | 3 refills | Status: DC

## 2020-07-19 NOTE — Patient Instructions (Signed)
-   Decrease  Lantus 24 units daily  - Continue  Novolog 10 units with each meal  - Continue Farxiga 10mg  daily   - Novolog correctional insulin: ADD extra units on insulin to your meal-time Novolog  dose if your blood sugars are higher . Use the scale below to help guide you:   Blood sugar before meal Number of units to inject  Less than 155 0 unit  156-  180 1 units  181 -  205 2 units  206 -  230 3 units  231 -  255 4 units  256 -  280 5 units  281 -  305 6 units  306-  330 7 units          HOW TO TREAT LOW BLOOD SUGARS (Blood sugar LESS THAN 70 MG/DL)  Please follow the RULE OF 15 for the treatment of hypoglycemia treatment (when your (blood sugars are less than 70 mg/dL)    STEP 1: Take 15 grams of carbohydrates when your blood sugar is low, which includes:   3-4 GLUCOSE TABS  OR  3-4 OZ OF JUICE OR REGULAR SODA OR  ONE TUBE OF GLUCOSE GEL     STEP 2: RECHECK blood sugar in 15 MINUTES STEP 3: If your blood sugar is still low at the 15 minute recheck --> then, go back to STEP 1 and treat AGAIN with another 15 grams of carbohydrates.

## 2020-07-19 NOTE — Progress Notes (Signed)
Name: Todd Delgado  Age/ Sex: 60 y.o., male   MRN/ DOB: [id number], [birth year]     PCP: Eartha Inch, MD   Reason for Endocrinology Evaluation: Type 2 Diabetes Mellitus  Initial Endocrine Consultative Visit: 12/10/2018    PATIENT IDENTIFIER: [name] is a 60 y.o. male with a past medical history of T2DM, S/P Weight loss sx (04/2017), OSA, HTN and dyslipidemia . The patient has followed with Endocrinology clinic since 12/10/2018 for consultative assistance with management of his diabetes.  DIABETIC HISTORY:  [name] was diagnosed with T2DM in 1998. He has been on oral glycemic agents in the past with some reported intolerance. (Metformin- unclear, Byetta - halitosis and GI side effects ). His hemoglobin A1c has ranged from 7.0 % in 2018, peaking at 9.0%      Weight Loss :  Had a lap band sx  in 2009 and lost ~50 lbs weight loss. Pt had a revision of Lap Band with Roux-en-Y GB due to plataeu in weigh which results in a ~ 95 lbs loss.    On his initial visit to our clinic his A1c was 7.6%, was on MDI regimen and Invokana.   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  We continued MDI  regimen and  Invokana.        Today (07/19/2020): [name] is here for a follow up on diabetes care. He checks his blood sugars multiple times daily, through freestyle libre. The patient has had hypoglycemic episodes since the last clinic visit, which typically occur 2 x / week- most often occuring early morning and at times postprandial. . The patient is symptomatic with these episodes.   Took a break from weight loss program through [facility] Has been doing intermittent fasting , which he attributes his recent hypoglycemia episodes to   Has phone EKG which shows PVC's , no symptoms  Has been taking a total of 200 mg of Losartan .     [facility]iat[facility] - Dr[facility] Ludger [facility]   HOME DIABETES REGIMEN:   Lantus 28 units daily Novolog 12 units with meals - takes 10 units  Farxiga 10 mg daily      CONTINUOUS GLUCOSE MONITORING RECORD INTERPRETATION    Dates of Recording:2/22-07/19/2020  Sensor description:Freestyle Libre  Results statistics:   CGM use % of time 57  Average and SD 141/32.9  Time in range      73%  % Time Above 180 21  % Time above 250 1  % Time Below target 4    Glycemic patterns summary : Hyperglycemia noted postprandial , hypoglycemia overnight   Hyperglycemic episodes  Post-prandial   Hypoglycemic episodes occurred : Rare after correction and overnight   Overnight periods: trends down    DIABETIC COMPLICATIONS: Microvascular complications:   DR present - no treatment   Denies: neuropathy, CKD  Last eye exam: Completed 02/09/2020  Macrovascular complications:    Denies: CAD, PVD, CVA   HISTORY:  Past Medical History:  Past Medical History:  Diagnosis Date  . Chest pain   . CHF (congestive heart failure) (HCC)   . Chronic kidney disease   . Diabetes mellitus without complication (HCC)   . Hyperlipemia   . Hypertension   . Impotence   . Morbid obesity (HCC)   . Post-herpetic polyneuropathy   . Sleep apnea   . Sleep apnea   .  Vitamin D deficiency    Past Surgical History:  Past Surgical History:  Procedure Laterality Date  . BARIATRIC SURGERY  03/2017   Lap band to Roux-en-Y gastric bypass   . LAPAROSCOPIC GASTRIC BANDING  July 2009    Dr. Grier Mitts    Social History:  reports that he has never smoked. He has never used smokeless tobacco. He reports that he does not drink alcohol and does not use drugs. Family History:  Family History  Problem Relation Age of Onset  . Myasthenia gravis Mother   . Heart disease Father   . Diabetes Father   . Hypertension Father   . Heart failure Father   . Heart disease Other      HOME MEDICATIONS: Allergies as of 07/19/2020      Reactions   Spironolactone Other (See Comments)    gynecomastia      Medication List       Accurate as of July 19, 2020  3:33 PM. If you have any questions, ask your nurse or doctor.        5-HTP 100 MG Caps Take by mouth.   amLODipine 5 MG tablet Commonly known as: NORVASC   atorvastatin 20 MG tablet Commonly known as: LIPITOR Take 1 tablet (20 mg total) by mouth daily.   b complex vitamins tablet Take 1 tablet by mouth daily.   BIOTIN PO Take 75 mg by mouth.   cholecalciferol 1000 units tablet Commonly known as: VITAMIN D Take 2,000 Units by mouth daily.   CINNAMON PO Take 1,000 mg by mouth.   cloNIDine 0.3 mg/24hr patch Commonly known as: CATAPRES - Dosed in mg/24 hr Place 0.3 mg onto the skin once a week.   CRANBERRY PO Take by mouth.   dapagliflozin propanediol 10 MG Tabs tablet Commonly known as: Farxiga Take 1 tablet (10 mg total) by mouth daily before breakfast.   diltiazem 300 MG 24 hr capsule Commonly known as: CARDIZEM CD Take 1 capsule (300 mg total) by mouth daily.   FreeStyle [employer] Misc by Does not apply route.   hydrALAZINE 25 MG tablet Commonly known as: APRESOLINE Take 1 tablet (25 mg total) by mouth 2 (two) times daily.   Lantus SoloStar 100 UNIT/ML Solostar Pen Generic drug: insulin glargine Inject 28 Units into the skin daily. Inject 28 units under the skin once daily.   losartan 50 MG tablet Commonly known as: COZAAR TAKE 1 TABLET(50 MG) BY MOUTH TWICE DAILY AS DIRECTED What changed: See the new instructions.   multivitamin with minerals tablet Take 1 tablet by mouth daily.   Nebivolol HCl 20 MG Tabs Commonly known as: Bystolic Take 1 tablet (20 mg total) by mouth daily.   NovoLOG FlexPen 100 UNIT/ML FlexPen Generic drug: insulin aspart 12 Units.   PROBIOTIC DAILY PO Take by mouth daily.   sildenafil 50 MG tablet Commonly known as: VIAGRA Take 50 mg by mouth daily as needed for erectile dysfunction.   tadalafil 20 MG tablet Commonly known as:  CIALIS Take 1 tablet (20 mg total) by mouth daily as needed for erectile dysfunction.   UNABLE TO FIND Take by mouth daily. Med Name: MEGA RED  Take as directed.   vitamin C 500 MG tablet Commonly known as: ASCORBIC ACID Take 500 mg by mouth daily.        OBJECTIVE:   Vital Signs: BP 130/82   Pulse 66   Ht 6' (1.829 m)   Wt (!) 315 lb 4 oz (  143 kg)   SpO2 98%   BMI 42.76 kg/m   Wt Readings from Last 3 Encounters:  07/19/20 (!) 315 lb 4 oz (143 kg)  01/12/20 (!) 324 lb 3.2 oz (147.1 kg)  09/08/19 (!) 312 lb 9.6 oz (141.8 kg)     Exam: General: Pt appears well and is in NAD  Lungs: Clear with good BS bilat with no rales, rhonchi, or wheezes  Heart: RRR with normal S1 and S2 and no gallops; no murmurs; no rub  Extremities: 1+ pretibial edema.   Skin: Normal texture and temperature to palpation.   Neuro: MS is good with appropriate affect, pt is alert and Ox3     DM foot exam:01/12/2020 The skin of the feet is intact without sores or ulcerations. The pedal pulses are 2+ on right and 2+ on left. The sensation is intact to a screening 5.07, 10 gram monofilament bilaterally    DATA REVIEWED:  Lab Results  Component Value Date   HGBA1C 6.4 (A) 07/19/2020   HGBA1C 6.9 (A) 01/12/2020   HGBA1C 6.6 (A) 09/08/2019     ASSESSMENT / PLAN / RECOMMENDATIONS:   1) Type 2 Diabetes Mellitus, With retinopathy complications - Most recent A1c of 6.4 %. Goal A1c < 7.0 %.    - A1c at goal but has been having hypoglycemic episodes, will reduce basal rate as some have been noted overnight.  - He tends to forget supper prandial insulin ,I went over with the patient various strategies as to how he can be reminded to take the medication - He had to decrease prandial insulin from 12 units to 10 units due to hypoglycemia    MEDICATIONS:  Decrease  Lantus 24 units daily   Continue  Novolog 10 units TIDQAC  Continue Farxiga 10 mg daily   CF: Novolog ( BG -130/25)      EDUCATION / INSTRUCTIONS:  BG monitoring instructions: Patient is instructed to check his blood sugars 4 times a day, before meals and bedtime .  Call [facility] if: BG persistently < 70  . I reviewed the Rule of 15 for the treatment of hypoglycemia in detail with the patient. Literature supplied.  2) HTN :   - This is well controlled today but has been taking Losartan 200 mg daily, pt advised to reduce to 100 mg a day  - Continue Amlodipine and Bystolic  - Pt to follow up with PCP for further management if needed      I spent 25  minutes preparing to see the patient by review of recent labs, imaging and procedures, obtaining and reviewing separately obtained history, communicating with the patient, ordering medications, tests or procedures, and documenting clinical information in the EHR including the differential Dx, treatment, and any further evaluation and other management     F/U in 6 months   Signed electronically by: Lyndle Herrlich, MD  [facility]  [facility] [address] [place] [place], [place] [address] Phone: [phone] FAX: [phone]   CC: Eartha Inch, MD [address] [place] [address] Phone: [phone]  Fax: [phone]  Return to Endocrinology clinic as below: Future Appointments  Date Time Provider Department Center  07/19/2020  3:40 PM Harvir Patry, Konrad Dolores, MD [facility] None

## 2020-07-22 ENCOUNTER — Other Ambulatory Visit: Payer: Self-pay | Admitting: Internal Medicine

## 2020-07-22 DIAGNOSIS — Z794 Long term (current) use of insulin: Secondary | ICD-10-CM

## 2020-07-22 DIAGNOSIS — E1165 Type 2 diabetes mellitus with hyperglycemia: Secondary | ICD-10-CM

## 2020-07-22 MED ORDER — LANTUS SOLOSTAR 100 UNIT/ML ~~LOC~~ SOPN: 24.0000 [IU] | PEN_INJECTOR | Freq: Every day | SUBCUTANEOUS | 1 refills | Status: DC

## 2020-09-20 ENCOUNTER — Telehealth: Payer: Self-pay | Admitting: Internal Medicine

## 2020-09-21 NOTE — Telephone Encounter (Signed)
Received a fax form [facility] on 09/20/2020. Patient's insurance does not cover Lantus. Per Rx benefit plan alternative medication include: Semglee-YFGN  Please advise.

## 2020-09-22 ENCOUNTER — Encounter: Payer: Self-pay | Admitting: Internal Medicine

## 2020-09-23 MED ORDER — INSULIN GLARGINE-YFGN 100 UNIT/ML ~~LOC~~ SOLN: 24.0000 [IU] | Freq: Every day | SUBCUTANEOUS | 3 refills | Status: DC

## 2020-09-23 NOTE — Telephone Encounter (Signed)
Reply to patient on MyChart

## 2020-10-18 DIAGNOSIS — Z6841 Body Mass Index (BMI) 40.0 and over, adult: Secondary | ICD-10-CM | POA: Diagnosis not present

## 2020-10-18 DIAGNOSIS — I10 Essential (primary) hypertension: Secondary | ICD-10-CM | POA: Diagnosis not present

## 2020-10-18 DIAGNOSIS — E1169 Type 2 diabetes mellitus with other specified complication: Secondary | ICD-10-CM | POA: Diagnosis not present

## 2020-10-18 DIAGNOSIS — R2232 Localized swelling, mass and lump, left upper limb: Secondary | ICD-10-CM | POA: Diagnosis not present

## 2020-10-18 DIAGNOSIS — E785 Hyperlipidemia, unspecified: Secondary | ICD-10-CM | POA: Diagnosis not present

## 2021-01-24 ENCOUNTER — Ambulatory Visit: Payer: BC Managed Care – PPO | Admitting: Internal Medicine

## 2021-02-14 DIAGNOSIS — E119 Type 2 diabetes mellitus without complications: Secondary | ICD-10-CM | POA: Diagnosis not present

## 2021-02-14 LAB — HM DIABETES EYE EXAM

## 2021-03-04 ENCOUNTER — Other Ambulatory Visit: Payer: Self-pay

## 2021-03-04 ENCOUNTER — Ambulatory Visit: Payer: BC Managed Care – PPO | Admitting: Internal Medicine

## 2021-03-04 ENCOUNTER — Encounter: Payer: Self-pay | Admitting: Internal Medicine

## 2021-03-04 VITALS — BP 126/74 | HR 78 | Ht 72.0 in | Wt 331.0 lb

## 2021-03-04 DIAGNOSIS — Z794 Long term (current) use of insulin: Secondary | ICD-10-CM | POA: Diagnosis not present

## 2021-03-04 DIAGNOSIS — E1165 Type 2 diabetes mellitus with hyperglycemia: Secondary | ICD-10-CM | POA: Diagnosis not present

## 2021-03-04 LAB — POCT GLYCOSYLATED HEMOGLOBIN (HGB A1C): Hemoglobin A1C: 7.2 % — AB (ref 4.0–5.6)

## 2021-03-04 MED ORDER — INSULIN GLARGINE-YFGN 100 UNIT/ML ~~LOC~~ SOLN: 22.0000 [IU] | Freq: Every day | SUBCUTANEOUS | 3 refills | Status: DC

## 2021-03-04 MED ORDER — DEXCOM G6 TRANSMITTER MISC: 1.0000 | 3 refills | Status: DC

## 2021-03-04 MED ORDER — TIRZEPATIDE 2.5 MG/0.5ML ~~LOC~~ SOAJ: 2.5000 mg | SUBCUTANEOUS | 3 refills | Status: DC

## 2021-03-04 MED ORDER — DEXCOM G6 SENSOR MISC: 1.0000 | 3 refills | Status: DC

## 2021-03-04 NOTE — Progress Notes (Signed)
Name: Todd Delgado  Age/ Sex: 60 y.o., male   MRN/ DOB: [id number], 09/26/1960     PCP: Todd Inch, MD   Reason for Endocrinology Evaluation: Type 2 Diabetes Mellitus  Initial Endocrine Consultative Visit: 12/10/2018    PATIENT IDENTIFIER: Todd Delgado is a 60 y.o. male with a past medical history of T2DM, S/P Weight loss sx (04/2017), OSA, HTN and dyslipidemia . The patient has followed with Endocrinology clinic since 12/10/2018 for consultative assistance with management of his diabetes.  DIABETIC HISTORY:  Mr. Ohms was diagnosed with T2DM in 1998. He has been on oral glycemic agents in the past with some reported intolerance. (Metformin- unclear, Byetta - halitosis and GI side effects ). His hemoglobin A1c has ranged from 7.0 % in 2018, peaking at 9.0%      Weight Loss :  Had a lap band sx  in 2009 and lost ~50 lbs weight loss. Pt had a revision of Lap Band with Roux-en-Y GB due to plataeu in weigh which results in a ~ 95 lbs loss.     On his initial visit to our clinic his A1c was 7.6%, was on MDI regimen and Invokana.    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  We continued MDI  regimen and  Invokana.        Today (03/04/2021): [name] is here for a follow up on diabetes care. He checks his blood sugars multiple times daily, through freestyle libre. The patient has had hypoglycemic episodes since the last clinic visit, which typically occur 2 x / week- most often occurring variably   Denies nausea , vomiting or diarrhea     Podiatry - Dr. Ludger [facility]ting  ( [facility]   HOME DIABETES REGIMEN:  Semglee 24 units daily Novolog 10 units with meals  Farxiga 10 mg daily   CF: Novolog (BG-130/25)     CONTINUOUS GLUCOSE MONITORING RECORD INTERPRETATION    Dates of Recording:10/8-10/21/2022  Sensor description:Freestyle Libre  Results statistics:   CGM use % of time 51   Average and SD 151/39.4  Time in range      66%  % Time Above 180 24  % Time above 250 5  % Time Below target 5    Glycemic patterns summary : Hyperglycemia noted during the day and trend down at night   Hyperglycemic episodes  Post-prandial   Hypoglycemic episodes occurred : Rare after correction and overnight   Overnight periods: trends down    DIABETIC COMPLICATIONS: Microvascular complications:   mild NPDR present - no treatment  Denies: neuropathy, CKD Last eye exam: Completed 02/14/2021   Macrovascular complications:    Denies: CAD, PVD, CVA    HISTORY:  Past Medical History:  Past Medical History:  Diagnosis Date   Chest pain    CHF (congestive heart failure) (HCC)    Chronic kidney disease    Diabetes mellitus without complication (HCC)    Hyperlipemia    Hypertension    Impotence    Morbid obesity (HCC)    Post-herpetic polyneuropathy    Sleep apnea    Sleep apnea    Vitamin D deficiency    Past Surgical History:  Past Surgical History:  Procedure Laterality Date   BARIATRIC SURGERY  03/2017   Lap band to Roux-en-Y gastric bypass    LAPAROSCOPIC GASTRIC BANDING  July 2009    Dr. Kirtland Bouchard  Delgado   Social History:  reports that he has never smoked. He has never used smokeless tobacco. He reports that he does not drink alcohol and does not use drugs. Family History:  Family History  Problem Relation Age of Onset   Myasthenia gravis Mother    Heart disease Father    Diabetes Father    Hypertension Father    Heart failure Father    Heart disease Other      HOME MEDICATIONS: Allergies as of 03/04/2021       Reactions   Spironolactone Other (See Comments)   gynecomastia        Medication List        Accurate as of March 04, 2021  4:24 PM. If you have any questions, ask your nurse or doctor.          5-HTP 100 MG Caps Take by mouth.   amLODipine 5 MG tablet Commonly known as: NORVASC   atorvastatin 20 MG tablet Commonly known as:  LIPITOR Take 1 tablet (20 mg total) by mouth daily.   b complex vitamins tablet Take 1 tablet by mouth daily.   BIOTIN PO Take 75 mg by mouth.   cholecalciferol 1000 units tablet Commonly known as: VITAMIN D Take 2,000 Units by mouth daily.   CINNAMON PO Take 1,000 mg by mouth.   CRANBERRY PO Take by mouth.   dapagliflozin propanediol 10 MG Tabs tablet Commonly known as: Farxiga Take 1 tablet (10 mg total) by mouth daily before breakfast.   Dexcom G6 Transmitter Misc 1 Device by Does not apply route as directed. Started by: Scarlette Shorts, MD   FreeStyle [facility] Misc by Does not apply route. What changed: Another medication with the same name was added. Make sure you understand how and when to take each. Changed by: Scarlette Shorts, MD   Dexcom G6 Sensor Misc 1 Device by Does not apply route as directed. What changed: You were already taking a medication with the same name, and this prescription was added. Make sure you understand how and when to take each. Changed by: Scarlette Shorts, MD   insulin glargine-yfgn 100 UNIT/ML injection Commonly known as: Semglee (yfgn) Inject 24 Units into the skin daily.   Insulin Pen Needle 31G X 5 MM Misc 1 Device by Does not apply route in the morning, at noon, in the evening, and at bedtime.   losartan 50 MG tablet Commonly known as: COZAAR TAKE 1 TABLET(50 MG) BY MOUTH TWICE DAILY AS DIRECTED What changed: See the new instructions.   multivitamin with minerals tablet Take 1 tablet by mouth daily.   Nebivolol HCl 20 MG Tabs Commonly known as: Bystolic Take 1 tablet (20 mg total) by mouth daily.   NovoLOG FlexPen 100 UNIT/ML FlexPen Generic drug: insulin aspart Max daily 50 units   PROBIOTIC DAILY PO Take by mouth daily.   sildenafil 50 MG tablet Commonly known as: VIAGRA Take 50 mg by mouth daily as needed for erectile dysfunction.   tadalafil 20 MG tablet Commonly known as:  CIALIS Take 1 tablet (20 mg total) by mouth daily as needed for erectile dysfunction.   tirzepatide 2.5 MG/0.5ML Pen Commonly known as: MOUNJARO Inject 2.5 mg into the skin once a week. Started by: Scarlette Shorts, MD   UNABLE TO FIND Take by mouth daily. Med Name: MEGA RED  Take as directed.   vitamin C 500 MG tablet Commonly known as: ASCORBIC ACID Take 500 mg by  mouth daily.         OBJECTIVE:   Vital Signs: BP 126/74 (BP Location: Left Arm, Patient Position: Sitting, Cuff Size: Large)   Pulse 78   Ht 6' (1.829 m)   Wt (!) 331 lb (150.1 kg)   SpO2 99%   BMI 44.89 kg/m   Wt Readings from Last 3 Encounters:  03/04/21 (!) 331 lb (150.1 kg)  07/19/20 (!) 315 lb 4 oz (143 kg)  01/12/20 (!) 324 lb 3.2 oz (147.1 kg)     Exam: General: Pt appears well and is in NAD  Lungs: Clear with good BS bilat   Heart: RRR   Extremities: Trace pretibial edema.   Neuro: MS is good with appropriate affect, pt is alert and Ox3     DM foot exam:01/12/2020  The skin of the feet is intact without sores or ulcerations. The pedal pulses are 2+ on right and 2+ on left. The sensation is intact to a screening 5.07, 10 gram monofilament bilaterally    DATA REVIEWED:  Lab Results  Component Value Date   HGBA1C 7.2 (A) 03/04/2021   HGBA1C 6.4 (A) 07/19/2020   HGBA1C 6.9 (A) 01/12/2020     ASSESSMENT / PLAN / RECOMMENDATIONS:   1) Type 2 Diabetes Mellitus, suboptimally controlled, with retinopathic complications - Most recent A1c of 7.2 %. Goal A1c < 7.0 %.    - A1c has slightly trended up -In review of his CGM download the patient has been having hypoglycemia during the day, this is attributed due to insulin carbohydrate mismatch, patient also tends to take NovoLog after meal if he forgot to take it while eating, we discussed that this will also increase his risk of hypoglycemia and if he forgets to take NovoLog preprandial then he may use correction factor for postprandial  hyperglycemia, rather than taking a standing dose of NovoLog later -We discussed  Tirazepatide as an add-on therapy, discussed GI side effects, he has no history of pancreatitis, he was provided with a coupon -We will adjust his insulin as below -I am also going to change his sensitivity factor from 25 to 30 -He would like to try Dexcom, a prescription has been sent to [facility] and   MEDICATIONS: Decrease Semglee 22 units daily  Decrease Novolog 8 units TIDQAC Continue Farxiga 10 mg daily  CF: Novolog ( BG -130/30)  Start Tirazepatide 2.5 mg weekly    EDUCATION / INSTRUCTIONS: BG monitoring instructions: Patient is instructed to check his blood sugars 4 times a day, before meals and bedtime . Call [facility] if: BG persistently < 70  I reviewed the Rule of 15 for the treatment of hypoglycemia in detail with the patient. Literature supplied.      F/U in 3 months   Signed electronically by: Lyndle Herrlich, MD  [facility] End[facility]  [facility] [address] [place], [place] [address] Phone: [phone] FAX: [phone]   CC: Todd Inch, MD [address] [place] [address] Phone: [phone]  Fax: [phone]  Return to Endocrinology clinic as below: Future Appointments  Date Time Provider Department Center  06/10/2021 11:10 AM Agapita Savarino, Konrad Dolores, MD [facility] None

## 2021-03-04 NOTE — Patient Instructions (Addendum)
-   Decrease  Lantus 22 units daily  - Continue  Novolog 8 units with each meal  - Continue Farxiga 10mg  daily  - Start Mounjaro 2.5 mg weekly  - Novolog correctional insulin: ADD extra units on insulin to your meal-time Novolog dose if your blood sugars are higher than 160. Use the scale below to help guide you:   Blood sugar before meal Number of units to inject  Less than 160 0 unit  161 -  190 1 units  191 -  220 2 units  221 -  250 3 units  251 -  280 4 units  281 -  310 5 units  311 -  340 6 units  341 - 370  7 units        HOW TO TREAT LOW BLOOD SUGARS (Blood sugar LESS THAN 70 MG/DL) Please follow the RULE OF 15 for the treatment of hypoglycemia treatment (when your (blood sugars are less than 70 mg/dL)   STEP 1: Take 15 grams of carbohydrates when your blood sugar is low, which includes:  3-4 GLUCOSE TABS  OR 3-4 OZ OF JUICE OR REGULAR SODA OR ONE TUBE OF GLUCOSE GEL    STEP 2: RECHECK blood sugar in 15 MINUTES STEP 3: If your blood sugar is still low at the 15 minute recheck --> then, go back to STEP 1 and treat AGAIN with another 15 grams of carbohydrates.

## 2021-03-08 ENCOUNTER — Telehealth: Payer: Self-pay | Admitting: Pharmacy Technician

## 2021-03-08 ENCOUNTER — Other Ambulatory Visit (HOSPITAL_COMMUNITY): Payer: Self-pay

## 2021-03-08 NOTE — Telephone Encounter (Signed)
Patient Advocate Encounter   Received notification from CoverMyMeds that prior authorization for [facility] 2.5mg  is required.   PA asks if the pt has failed, have intolerance or contraindication to the preferred medications: Ozempic, Rybelsus, Trulicity, AND Victoza.  I did not see that he's been on any of these, in his chart. Would you like to change it to one of these or have him use the coupon?

## 2021-03-08 NOTE — Telephone Encounter (Signed)
Patient Advocate Encounter   Received notification from CoverMyMeds that prior authorization for [facility] 2.5mg  is required.   PA submitted on 03/08/21 Key [id number] Status is pending    [facility] will continue to follow:   Sherilyn Dacosta, CPhT Patient Advocate [facility] Phone: [phone] Fax:  [phone]

## 2021-03-11 DIAGNOSIS — K912 Postsurgical malabsorption, not elsewhere classified: Secondary | ICD-10-CM | POA: Diagnosis not present

## 2021-03-11 DIAGNOSIS — Z9884 Bariatric surgery status: Secondary | ICD-10-CM | POA: Diagnosis not present

## 2021-03-15 NOTE — Telephone Encounter (Signed)
Received a fax regarding Prior Authorization from [employer] for [facility]. Authorization has been DENIED because PT HAS NOT TRIED AND FAILED ALL FORMULARY MEDICATIONS: OZEMPIC, RYBELSUS, TRULICITY, AND VICTOZA.

## 2021-03-25 DIAGNOSIS — I10 Essential (primary) hypertension: Secondary | ICD-10-CM | POA: Diagnosis not present

## 2021-03-25 DIAGNOSIS — Z9884 Bariatric surgery status: Secondary | ICD-10-CM | POA: Diagnosis not present

## 2021-03-25 DIAGNOSIS — K912 Postsurgical malabsorption, not elsewhere classified: Secondary | ICD-10-CM | POA: Diagnosis not present

## 2021-04-11 ENCOUNTER — Telehealth: Payer: Self-pay | Admitting: Pharmacy Technician

## 2021-04-11 ENCOUNTER — Other Ambulatory Visit (HOSPITAL_COMMUNITY): Payer: Self-pay

## 2021-04-11 NOTE — Telephone Encounter (Signed)
Patient Advocate Encounter   Received notification from [facility] that prior authorization for BD Pen needles mini 31Gx29mm is required by his/her insurance [employer].  Per Test Claim: Ins only covers trueplus pen needles.   Spoke with Pharmacy to process. They will order them for the pt.

## 2021-04-15 DIAGNOSIS — G4733 Obstructive sleep apnea (adult) (pediatric): Secondary | ICD-10-CM | POA: Diagnosis not present

## 2021-04-15 DIAGNOSIS — E1169 Type 2 diabetes mellitus with other specified complication: Secondary | ICD-10-CM | POA: Diagnosis not present

## 2021-04-15 DIAGNOSIS — Z794 Long term (current) use of insulin: Secondary | ICD-10-CM | POA: Diagnosis not present

## 2021-04-15 DIAGNOSIS — Z6841 Body Mass Index (BMI) 40.0 and over, adult: Secondary | ICD-10-CM | POA: Diagnosis not present

## 2021-04-15 DIAGNOSIS — I10 Essential (primary) hypertension: Secondary | ICD-10-CM | POA: Diagnosis not present

## 2021-04-26 DIAGNOSIS — G4733 Obstructive sleep apnea (adult) (pediatric): Secondary | ICD-10-CM | POA: Diagnosis not present

## 2021-05-17 ENCOUNTER — Encounter: Payer: Self-pay | Admitting: Internal Medicine

## 2021-05-18 ENCOUNTER — Other Ambulatory Visit: Payer: Self-pay

## 2021-05-18 MED ORDER — DEXCOM G6 SENSOR MISC: 1.0000 | 3 refills | Status: DC

## 2021-05-18 NOTE — Telephone Encounter (Signed)
Script sent  

## 2021-05-18 NOTE — Telephone Encounter (Signed)
Patient wife picked up 1 Dexcom G6

## 2021-05-23 DIAGNOSIS — F54 Psychological and behavioral factors associated with disorders or diseases classified elsewhere: Secondary | ICD-10-CM | POA: Diagnosis not present

## 2021-05-23 DIAGNOSIS — F39 Unspecified mood [affective] disorder: Secondary | ICD-10-CM | POA: Diagnosis not present

## 2021-05-23 DIAGNOSIS — Z6841 Body Mass Index (BMI) 40.0 and over, adult: Secondary | ICD-10-CM | POA: Diagnosis not present

## 2021-05-30 DIAGNOSIS — Z713 Dietary counseling and surveillance: Secondary | ICD-10-CM | POA: Diagnosis not present

## 2021-05-30 DIAGNOSIS — Z9884 Bariatric surgery status: Secondary | ICD-10-CM | POA: Diagnosis not present

## 2021-06-10 ENCOUNTER — Encounter: Payer: Self-pay | Admitting: Internal Medicine

## 2021-06-10 ENCOUNTER — Other Ambulatory Visit: Payer: Self-pay

## 2021-06-10 ENCOUNTER — Ambulatory Visit: Payer: BC Managed Care – PPO | Admitting: Internal Medicine

## 2021-06-10 VITALS — BP 138/80 | HR 64 | Ht 72.0 in | Wt 315.0 lb

## 2021-06-10 DIAGNOSIS — E113299 Type 2 diabetes mellitus with mild nonproliferative diabetic retinopathy without macular edema, unspecified eye: Secondary | ICD-10-CM

## 2021-06-10 DIAGNOSIS — E1165 Type 2 diabetes mellitus with hyperglycemia: Secondary | ICD-10-CM | POA: Diagnosis not present

## 2021-06-10 DIAGNOSIS — Z794 Long term (current) use of insulin: Secondary | ICD-10-CM

## 2021-06-10 LAB — POCT GLYCOSYLATED HEMOGLOBIN (HGB A1C): Hemoglobin A1C: 6.7 % — AB (ref 4.0–5.6)

## 2021-06-10 MED ORDER — DAPAGLIFLOZIN PROPANEDIOL 10 MG PO TABS: 10.0000 mg | ORAL_TABLET | Freq: Every day | ORAL | 3 refills | Status: DC

## 2021-06-10 MED ORDER — NOVOLOG FLEXPEN 100 UNIT/ML ~~LOC~~ SOPN: PEN_INJECTOR | SUBCUTANEOUS | 3 refills | Status: DC

## 2021-06-10 MED ORDER — TRUEPLUS PEN NEEDLES 31G X 6 MM MISC: 1.0000 | Freq: Four times a day (QID) | 3 refills | Status: DC

## 2021-06-10 MED ORDER — INSULIN GLARGINE-YFGN 100 UNIT/ML ~~LOC~~ SOLN: 22.0000 [IU] | Freq: Every day | SUBCUTANEOUS | 3 refills | Status: DC

## 2021-06-10 MED ORDER — TIRZEPATIDE 5 MG/0.5ML ~~LOC~~ SOAJ
5.0000 mg | SUBCUTANEOUS | 3 refills | Status: DC
Start: 2021-06-10 — End: 2021-06-20

## 2021-06-10 NOTE — Patient Instructions (Signed)
-   Continue Semglee 22 units daily  - Change Novolog 8 units with Breakfast, 8 units with Lunch and 6 units with Supper  - Continue Farxiga 10mg  daily  - Increase Mounjaro 5 mg weekly  - Novolog correctional insulin: ADD extra units on insulin to your meal-time Novolog dose if your blood sugars are higher than 160. Use the scale below to help guide you:   Blood sugar before meal Number of units to inject  Less than 160 0 unit  161 -  190 1 units  191 -  220 2 units  221 -  250 3 units  251 -  280 4 units  281 -  310 5 units  311 -  340 6 units  341 - 370  7 units        HOW TO TREAT LOW BLOOD SUGARS (Blood sugar LESS THAN 70 MG/DL) Please follow the RULE OF 15 for the treatment of hypoglycemia treatment (when your (blood sugars are less than 70 mg/dL)   STEP 1: Take 15 grams of carbohydrates when your blood sugar is low, which includes:  3-4 GLUCOSE TABS  OR 3-4 OZ OF JUICE OR REGULAR SODA OR ONE TUBE OF GLUCOSE GEL    STEP 2: RECHECK blood sugar in 15 MINUTES STEP 3: If your blood sugar is still low at the 15 minute recheck --> then, go back to STEP 1 and treat AGAIN with another 15 grams of carbohydrates.

## 2021-06-10 NOTE — Progress Notes (Signed)
Name: Todd Delgado  Age/ Sex: 61 y.o., male   MRN/ DOB: [id number], November 01, 1960     PCP: Eartha Inch, MD   Reason for Endocrinology Evaluation: Type 2 Diabetes Mellitus  Initial Endocrine Consultative Visit: 12/10/2018    PATIENT IDENTIFIER: Todd Delgado is a 61 y.o. male with a past medical history of T2DM, S/P Weight loss sx (04/2017), OSA, HTN and dyslipidemia . The patient has followed with Endocrinology clinic since 12/10/2018 for consultative assistance with management of his diabetes.  DIABETIC HISTORY:  Todd Delgado was diagnosed with T2DM in 1998. He has been on oral glycemic agents in the past with some reported intolerance. (Metformin- unclear, Byetta - halitosis and GI side effects ). His hemoglobin A1c has ranged from 7.0 % in 2018, peaking at 9.0%      Weight Loss :  Had a lap band sx  in 2009 and lost ~50 lbs weight loss. Pt had a revision of Lap Band with Roux-en-Y GB due to plataeu in weigh which results in a ~ 95 lbs loss.     On his initial visit to our clinic his A1c was 7.6%, was on MDI regimen and Invokana.   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  We continued MDI  regimen , [name] and started [place]       Today (06/10/2021): Todd Delgado is here for a follow up on diabetes care. He checks his blood sugars multiple times daily, through freestyle libre. The patient has had hypoglycemic episodes since the last clinic visit, which typically occur 2 x / week- most often occurring variably    He continues to follow-up with the bariatric clinic in [place] Denies nausea , vomiting or diarrhea     [facility]iat[facility] - Dr. Ludger [facility]   HOME DIABETES REGIMEN:  Semglee 22 units daily Novolog 8 units with meals  Farxiga 10 mg daily   CF: Novolog  (BG-130/25) Tirazepatide 2.5 mg weekly    CONTINUOUS GLUCOSE MONITORING RECORD INTERPRETATION    Dates of Recording:1/14-1/27/2023  Sensor description: dexcom Results statistics:   CGM use % of time 50  Average and SD 157/46  Time in range    75 %  % Time Above 180 19  % Time above 250 5  % Time Below target <1    Glycemic patterns summary : Optimal Bg's overnight , but hyperglycemia noted during the day   Hyperglycemic episodes  Post-prandial   Hypoglycemic episodes occurred : Rare after bolus Overnight periods: trends down    DIABETIC COMPLICATIONS: Microvascular complications:   mild NPDR present -requiring  treatment  Denies: neuropathy, CKD Last eye exam: Completed 02/14/2021   Macrovascular complications:    Denies: CAD, PVD, CVA    HISTORY:  Past Medical History:  Past Medical History:  Diagnosis Date   Chest pain    CHF (congestive heart failure) (HCC)    Chronic kidney disease    Diabetes mellitus without complication (HCC)    Hyperlipemia    Hypertension    Impotence    Morbid obesity (HCC)    Post-herpetic polyneuropathy    Sleep apnea    Sleep apnea    Vitamin D deficiency    Past Surgical History:  Past Surgical History:  Procedure Laterality Date   BARIATRIC  SURGERY  03/2017   Lap band to Roux-en-Y gastric bypass    LAPAROSCOPIC GASTRIC BANDING  July 2009    Dr. Grier MittsK Earle   Social History:  reports that he has never smoked. He has never used smokeless tobacco. He reports that he does not drink alcohol and does not use drugs. Family History:  Family History  Problem Relation Age of Onset   Myasthenia gravis Mother    Heart disease Father    Diabetes Father    Hypertension Father    Heart failure Father    Heart disease Other      HOME MEDICATIONS: Allergies as of 06/10/2021       Reactions   Spironolactone Other (See Comments)   gynecomastia        Medication List        Accurate as of June 10, 2021 12:05 PM. If you  have any questions, ask your nurse or doctor.          STOP taking these medications    tirzepatide 2.5 MG/0.5ML Pen Commonly known as: MOUNJARO Replaced by: tirzepatide 5 MG/0.5ML Pen Stopped by: Scarlette ShortsIbtehal J Kery Haltiwanger, MD       TAKE these medications    5-HTP 100 MG Caps Take by mouth.   amLODipine 5 MG tablet Commonly known as: NORVASC   atorvastatin 20 MG tablet Commonly known as: LIPITOR Take 1 tablet (20 mg total) by mouth daily.   b complex vitamins tablet Take 1 tablet by mouth daily.   BIOTIN PO Take 75 mg by mouth.   buPROPion 150 MG 12 hr tablet Commonly known as: ZYBAN Take 150 mg by mouth 2 (two) times daily.   cholecalciferol 1000 units tablet Commonly known as: VITAMIN D Take 2,000 Units by mouth daily.   CINNAMON PO Take 1,000 mg by mouth.   CRANBERRY PO Take by mouth.   dapagliflozin propanediol 10 MG Tabs tablet Commonly known as: Farxiga Take 1 tablet (10 mg total) by mouth daily before breakfast.   Dexcom G6 Sensor Misc 1 Device by Does not apply route as directed. What changed: Another medication with the same name was removed. Continue taking this medication, and follow the directions you see here. Changed by: Scarlette ShortsIbtehal J Kylen Schliep, MD   Dexcom G6 Transmitter Misc 1 Device by Does not apply route as directed.   insulin glargine-yfgn 100 UNIT/ML injection Commonly known as: Semglee (yfgn) Inject 0.22 mLs (22 Units total) into the skin daily.   losartan 50 MG tablet Commonly known as: COZAAR TAKE 1 TABLET(50 MG) BY MOUTH TWICE DAILY AS DIRECTED What changed: See the new instructions.   multivitamin with minerals tablet Take 1 tablet by mouth daily.   Nebivolol HCl 20 MG Tabs Commonly known as: Bystolic Take 1 tablet (20 mg total) by mouth daily.   NovoLOG FlexPen 100 UNIT/ML FlexPen Generic drug: insulin aspart Max daily 50 units   PROBIOTIC DAILY PO Take by mouth daily.   sildenafil 50 MG tablet Commonly known as:  VIAGRA Take 50 mg by mouth daily as needed for erectile dysfunction.   tadalafil 20 MG tablet Commonly known as: CIALIS Take 1 tablet (20 mg total) by mouth daily as needed for erectile dysfunction.   tirzepatide 5 MG/0.5ML Pen Commonly known as: MOUNJARO Inject 5 mg into the skin once a week. Replaces: tirzepatide 2.5 MG/0.5ML Pen Started by: Scarlette ShortsIbtehal J Ismaeel Arvelo, MD   TRUEplus Pen Needles 31G X 6 MM Misc Generic drug: Insulin Pen Needle 1 Device by Does  not apply route in the morning, at noon, in the evening, and at bedtime. What changed: medication strength Changed by: Scarlette Shorts, MD   UNABLE TO FIND Take by mouth daily. Med Name: MEGA RED  Take as directed.   vitamin C 500 MG tablet Commonly known as: ASCORBIC ACID Take 500 mg by mouth daily.         OBJECTIVE:   Vital Signs: BP 138/80 (BP Location: Left Arm, Patient Position: Sitting, Cuff Size: Large)    Pulse 64    Ht 6' (1.829 m)    Wt (!) 315 lb (142.9 kg)    SpO2 98%    BMI 42.72 kg/m   Wt Readings from Last 3 Encounters:  06/10/21 (!) 315 lb (142.9 kg)  03/04/21 (!) 331 lb (150.1 kg)  07/19/20 (!) 315 lb 4 oz (143 kg)     Exam: General: Pt appears well and is in NAD  Lungs: Clear with good BS bilat   Heart: RRR   Extremities: Trace pretibial edema.   Neuro: MS is good with appropriate affect, pt is alert and Ox3     DM foot exam: Per podiatry    DATA REVIEWED:  Lab Results  Component Value Date   HGBA1C 6.7 (A) 06/10/2021   HGBA1C 7.2 (A) 03/04/2021   HGBA1C 6.4 (A) 07/19/2020   03/11/2021 BUN 17 Creatinine 1.08 NA 143 K4.1 Calcium 9.2 AST 23 ALT 23 GFR 79 A1c 7.2% TG 51 HDL 51 LDL 54   ASSESSMENT / PLAN / RECOMMENDATIONS:   1) Type 2 Diabetes Mellitus, Optimally controlled, with retinopathic complications - Most recent A1c of 6.7 %. Goal A1c < 7.0 %.     -Praised the patient on improved glycemic control and weight loss -He is tolerating 1 daughter without side  effects, will increase as below -In review of his CGM, he has been noted with tight BG's after supper, will reduce prandial dose for supper -He was given printed prescriptions of all his glycemic agents, his pharmacy has been giving him 9 mL of NovoLog a month instead of 45 mL as the prescription states   MEDICATIONS: Continue Semglee 22 units daily  Change Novolog 8 units with breakfast, 8 units with lunch, 6 units with supper Continue Farxiga 10 mg daily  CF: Novolog ( BG -130/30)  Increase Tirazepatide 5 mg weekly    EDUCATION / INSTRUCTIONS: BG monitoring instructions: Patient is instructed to check his blood sugars 4 times a day, before meals and bedtime . Call [facility] if: BG persistently < 70  I reviewed the Rule of 15 for the treatment of hypoglycemia in detail with the patient. Literature supplied.      F/U in 6 months   Signed electronically by: Lyndle Herrlich, MD  [facility] Endocrinology  [facility] [address]ure[address] Joseph[address] [place], [place] [address] Phone: [phone] FAX: [phone]   CC: Eartha Inch, MD [address] [place] [address] Phone: [phone]  Fax: [phone]  Return to Endocrinology clinic as below: Future Appointments  Date Time Provider Department Center  12/09/2021 11:30 AM [place] Ret[place], Konrad Dolores, MD [facility] None

## 2021-06-13 ENCOUNTER — Encounter: Payer: Self-pay | Admitting: Internal Medicine

## 2021-06-20 ENCOUNTER — Other Ambulatory Visit: Payer: Self-pay

## 2021-06-20 ENCOUNTER — Encounter: Payer: Self-pay | Admitting: Internal Medicine

## 2021-06-20 DIAGNOSIS — E113299 Type 2 diabetes mellitus with mild nonproliferative diabetic retinopathy without macular edema, unspecified eye: Secondary | ICD-10-CM

## 2021-06-20 DIAGNOSIS — Z794 Long term (current) use of insulin: Secondary | ICD-10-CM

## 2021-06-20 MED ORDER — TIRZEPATIDE 5 MG/0.5ML ~~LOC~~ SOAJ: 5.0000 mg | SUBCUTANEOUS | 3 refills | Status: DC

## 2021-07-01 ENCOUNTER — Other Ambulatory Visit: Payer: Self-pay | Admitting: Internal Medicine

## 2021-07-08 DIAGNOSIS — Z713 Dietary counseling and surveillance: Secondary | ICD-10-CM | POA: Diagnosis not present

## 2021-07-08 DIAGNOSIS — Z9884 Bariatric surgery status: Secondary | ICD-10-CM | POA: Diagnosis not present

## 2021-07-15 DIAGNOSIS — G4733 Obstructive sleep apnea (adult) (pediatric): Secondary | ICD-10-CM | POA: Diagnosis not present

## 2021-07-15 DIAGNOSIS — E1169 Type 2 diabetes mellitus with other specified complication: Secondary | ICD-10-CM | POA: Diagnosis not present

## 2021-07-15 DIAGNOSIS — Z6841 Body Mass Index (BMI) 40.0 and over, adult: Secondary | ICD-10-CM | POA: Diagnosis not present

## 2021-07-15 DIAGNOSIS — I10 Essential (primary) hypertension: Secondary | ICD-10-CM | POA: Diagnosis not present

## 2021-07-15 DIAGNOSIS — Z794 Long term (current) use of insulin: Secondary | ICD-10-CM | POA: Diagnosis not present

## 2021-08-01 ENCOUNTER — Other Ambulatory Visit: Payer: Self-pay | Admitting: Internal Medicine

## 2021-08-01 DIAGNOSIS — E113299 Type 2 diabetes mellitus with mild nonproliferative diabetic retinopathy without macular edema, unspecified eye: Secondary | ICD-10-CM

## 2021-08-08 DIAGNOSIS — Z9884 Bariatric surgery status: Secondary | ICD-10-CM | POA: Diagnosis not present

## 2021-08-08 DIAGNOSIS — Z713 Dietary counseling and surveillance: Secondary | ICD-10-CM | POA: Diagnosis not present

## 2021-09-13 DIAGNOSIS — Z713 Dietary counseling and surveillance: Secondary | ICD-10-CM | POA: Diagnosis not present

## 2021-09-13 DIAGNOSIS — Z9884 Bariatric surgery status: Secondary | ICD-10-CM | POA: Diagnosis not present

## 2021-10-14 DIAGNOSIS — M25562 Pain in left knee: Secondary | ICD-10-CM | POA: Diagnosis not present

## 2021-10-14 DIAGNOSIS — Z6841 Body Mass Index (BMI) 40.0 and over, adult: Secondary | ICD-10-CM | POA: Diagnosis not present

## 2021-10-14 DIAGNOSIS — Z9884 Bariatric surgery status: Secondary | ICD-10-CM | POA: Diagnosis not present

## 2021-10-14 DIAGNOSIS — Z794 Long term (current) use of insulin: Secondary | ICD-10-CM | POA: Diagnosis not present

## 2021-10-14 DIAGNOSIS — E1169 Type 2 diabetes mellitus with other specified complication: Secondary | ICD-10-CM | POA: Diagnosis not present

## 2021-10-14 DIAGNOSIS — G8929 Other chronic pain: Secondary | ICD-10-CM | POA: Diagnosis not present

## 2021-10-14 DIAGNOSIS — I10 Essential (primary) hypertension: Secondary | ICD-10-CM | POA: Diagnosis not present

## 2021-10-25 DIAGNOSIS — G4733 Obstructive sleep apnea (adult) (pediatric): Secondary | ICD-10-CM | POA: Diagnosis not present

## 2021-10-26 DIAGNOSIS — G4733 Obstructive sleep apnea (adult) (pediatric): Secondary | ICD-10-CM | POA: Diagnosis not present

## 2021-10-27 DIAGNOSIS — E785 Hyperlipidemia, unspecified: Secondary | ICD-10-CM | POA: Diagnosis not present

## 2021-10-27 DIAGNOSIS — E1169 Type 2 diabetes mellitus with other specified complication: Secondary | ICD-10-CM | POA: Diagnosis not present

## 2021-10-27 DIAGNOSIS — I10 Essential (primary) hypertension: Secondary | ICD-10-CM | POA: Diagnosis not present

## 2021-10-27 DIAGNOSIS — G4733 Obstructive sleep apnea (adult) (pediatric): Secondary | ICD-10-CM | POA: Diagnosis not present

## 2021-10-27 DIAGNOSIS — M25562 Pain in left knee: Secondary | ICD-10-CM | POA: Diagnosis not present

## 2021-11-23 ENCOUNTER — Other Ambulatory Visit: Payer: Self-pay

## 2021-11-23 MED ORDER — DEXCOM G6 TRANSMITTER MISC: 1.0000 | 3 refills | Status: DC

## 2021-12-06 ENCOUNTER — Ambulatory Visit: Payer: BC Managed Care – PPO | Admitting: Internal Medicine

## 2021-12-06 NOTE — Progress Notes (Deleted)
Name: Todd Delgado  Age/ Sex: 61 y.o., male   MRN/ DOB: [id number], December 24, 1960     PCP: Eartha Inch, MD   Reason for Endocrinology Evaluation: Type 2 Diabetes Mellitus  Initial Endocrine Consultative Visit: 12/10/2018    PATIENT IDENTIFIER: Todd Delgado is a 61 y.o. male with a past medical history of T2DM, S/P Weight loss sx (04/2017), OSA, HTN and dyslipidemia . The patient has followed with Endocrinology clinic since 12/10/2018 for consultative assistance with management of his diabetes.  DIABETIC HISTORY:  Todd Delgado was diagnosed with T2DM in 1998. He has been on oral glycemic agents in the past with some reported intolerance. (Metformin- unclear, Byetta - halitosis and GI side effects ). His hemoglobin A1c has ranged from 7.0 % in 2018, peaking at 9.0%      Weight Loss :  Had a lap band sx  in 2009 and lost ~50 lbs weight loss. Pt had a revision of Lap Band with Roux-en-Y GB due to plataeu in weigh which results in a ~ 95 lbs loss.     On his initial visit to our clinic his A1c was 7.6%, was on MDI regimen and Invokana.   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  We continued MDI  regimen , [name] and started [place]       Today (12/06/2021): Todd Delgado is here for a follow up on diabetes care. He checks his blood sugars multiple times daily, through freestyle libre. The patient has had hypoglycemic episodes since the last clinic visit, which typically occur 2 x / week- most often occurring variably   He was seen in June for left knee pain, s/p fall He continues to follow-up with the bariatric clinic in [place] Denies nausea , vomiting or diarrhea     Podiatry - Dr. Ludger Nutting  ( [facility]   HOME DIABETES REGIMEN:  Semglee 22 units daily Novolog 8 units with meals  Farxiga 10  mg daily   CF: Novolog (BG-130/25) Tirazepatide 5 mg weekly    CONTINUOUS GLUCOSE MONITORING RECORD INTERPRETATION    Dates of Recording:1/14-1/27/2023  Sensor description: dexcom Results statistics:   CGM use % of time 50  Average and SD 157/46  Time in range    75 %  % Time Above 180 19  % Time above 250 5  % Time Below target <1    Glycemic patterns summary : Optimal Bg's overnight , but hyperglycemia noted during the day   Hyperglycemic episodes  Post-prandial   Hypoglycemic episodes occurred : Rare after bolus Overnight periods: trends down    DIABETIC COMPLICATIONS: Microvascular complications:   mild NPDR present -requiring  treatment  Denies: neuropathy, CKD Last eye exam: Completed 02/14/2021   Macrovascular complications:    Denies: CAD, PVD, CVA    HISTORY:  Past Medical History:  Past Medical History:  Diagnosis Date   Chest pain    CHF (congestive heart failure) (HCC)    Chronic kidney disease    Diabetes mellitus without complication (HCC)    Hyperlipemia    Hypertension    Impotence    Morbid obesity (HCC)    Post-herpetic polyneuropathy    Sleep apnea    Sleep apnea    Vitamin D deficiency    Past Surgical History:  Past Surgical History:  Procedure Laterality Date   BARIATRIC SURGERY  03/2017   Lap band to Roux-en-Y gastric bypass    LAPAROSCOPIC GASTRIC BANDING  July 2009    Dr. Grier Mitts   Social History:  reports that he has never smoked. He has never used smokeless tobacco. He reports that he does not drink alcohol and does not use drugs. Family History:  Family History  Problem Relation Age of Onset   Myasthenia gravis Mother    Heart disease Father    Diabetes Father    Hypertension Father    Heart failure Father    Heart disease Other      HOME MEDICATIONS: Allergies as of 12/06/2021       Reactions   Spironolactone Other (See Comments)   gynecomastia        Medication List        Accurate as of December 06, 2021  6:44 AM. If you have any questions, ask your nurse or doctor.          5-HTP 100 MG Caps Take by mouth.   amLODipine 5 MG tablet Commonly known as: NORVASC   atorvastatin 20 MG tablet Commonly known as: LIPITOR Take 1 tablet (20 mg total) by mouth daily.   b complex vitamins tablet Take 1 tablet by mouth daily.   BIOTIN PO Take 75 mg by mouth.   buPROPion 150 MG 12 hr tablet Commonly known as: ZYBAN Take 150 mg by mouth 2 (two) times daily.   cholecalciferol 1000 units tablet Commonly known as: VITAMIN D Take 2,000 Units by mouth daily.   CINNAMON PO Take 1,000 mg by mouth.   CRANBERRY PO Take by mouth.   Dexcom G6 Sensor Misc 1 Device by Does not apply route as directed.   Dexcom G6 Transmitter Misc 1 Device by Does not apply route as directed.   Farxiga 10 MG Tabs tablet Generic drug: dapagliflozin propanediol TAKE 1 TABLET BY MOUTH EVERY DAY BEFORE BREAKFAST   insulin glargine-yfgn 100 UNIT/ML injection Commonly known as: Semglee (yfgn) Inject 0.22 mLs (22 Units total) into the skin daily.   losartan 50 MG tablet Commonly known as: COZAAR TAKE 1 TABLET(50 MG) BY MOUTH TWICE DAILY AS DIRECTED What changed: See the new instructions.   multivitamin with minerals tablet Take 1 tablet by mouth daily.   Nebivolol HCl 20 MG Tabs Commonly known as: Bystolic Take 1 tablet (20 mg total) by mouth daily.   NovoLOG FlexPen 100 UNIT/ML FlexPen Generic drug: insulin aspart Max daily 50 units   PROBIOTIC DAILY PO Take by mouth daily.   sildenafil 50 MG tablet Commonly known as: VIAGRA Take 50 mg by mouth daily as needed for erectile dysfunction.   tadalafil 20 MG tablet Commonly known as: CIALIS Take 1 tablet (20 mg total) by mouth daily as needed for erectile dysfunction.   tirzepatide 5 MG/0.5ML Pen Commonly known as: MOUNJARO Inject 5 mg into the skin once a week.   TRUEplus Pen Needles 31G X 6 MM Misc Generic drug: Insulin Pen Needle 1  Device by Does not apply route in the morning, at noon, in the evening, and at bedtime.   UNABLE TO FIND Take by mouth daily. Med Name: MEGA RED  Take as directed.   vitamin C 500 MG tablet Commonly known as: ASCORBIC ACID Take 500 mg by mouth daily.         OBJECTIVE:   Vital Signs: There were no vitals taken for this  visit.  Wt Readings from Last 3 Encounters:  06/10/21 (!) 315 lb (142.9 kg)  03/04/21 (!) 331 lb (150.1 kg)  07/19/20 (!) 315 lb 4 oz (143 kg)     Exam: General: Pt appears well and is in NAD  Lungs: Clear with good BS bilat   Heart: RRR   Extremities: Trace pretibial edema.   Neuro: MS is good with appropriate affect, pt is alert and Ox3     DM foot exam: Per podiatry    DATA REVIEWED:  Lab Results  Component Value Date   HGBA1C 6.7 (A) 06/10/2021   HGBA1C 7.2 (A) 03/04/2021   HGBA1C 6.4 (A) 07/19/2020   03/11/2021 BUN 17 Creatinine 1.08 NA 143 K4.1 Calcium 9.2 AST 23 ALT 23 GFR 79 A1c 7.2% TG 51 HDL 51 LDL 54   ASSESSMENT / PLAN / RECOMMENDATIONS:   1) Type 2 Diabetes Mellitus, Optimally controlled, with retinopathic complications - Most recent A1c of 6.7 %. Goal A1c < 7.0 %.     -Praised the patient on improved glycemic control and weight loss -He is tolerating 1 daughter without side effects, will increase as below -In review of his CGM, he has been noted with tight BG's after supper, will reduce prandial dose for supper -He was given printed prescriptions of all his glycemic agents, his pharmacy has been giving him 9 mL of NovoLog a month instead of 45 mL as the prescription states   MEDICATIONS: Continue Semglee 22 units daily  Change Novolog 8 units with breakfast, 8 units with lunch, 6 units with supper Continue Farxiga 10 mg daily  CF: Novolog ( BG -130/30)  Increase Tirazepatide 5 mg weekly    EDUCATION / INSTRUCTIONS: BG monitoring instructions: Patient is instructed to check his blood sugars 4 times a day,  before meals and bedtime . Call [facility] if: BG persistently < 70  I reviewed the Rule of 15 for the treatment of hypoglycemia in detail with the patient. Literature supplied.      F/U in 6 months   Signed electronically by: Lyndle Herrlich, MD  [facility] Endocrinology  [facility] [address] [place], [place] [address] Phone: [phone] FAX: [phone]   CC: Eartha Inch, MD [address] [place] [address] Phone: [phone]  Fax: [phone]  Return to Endocrinology clinic as below: Future Appointments  Date Time Provider Department Center  12/06/2021  3:00 PM [place] Sample, Konrad Dolores, MD [facility] None

## 2021-12-09 ENCOUNTER — Ambulatory Visit: Payer: BC Managed Care – PPO | Admitting: Internal Medicine

## 2022-02-21 ENCOUNTER — Other Ambulatory Visit: Payer: Self-pay

## 2022-02-21 MED ORDER — DEXCOM G6 SENSOR MISC: 1.0000 | 0 refills | Status: DC

## 2022-03-03 DIAGNOSIS — K912 Postsurgical malabsorption, not elsewhere classified: Secondary | ICD-10-CM | POA: Diagnosis not present

## 2022-03-03 DIAGNOSIS — Z9884 Bariatric surgery status: Secondary | ICD-10-CM | POA: Diagnosis not present

## 2022-03-29 ENCOUNTER — Telehealth: Payer: Self-pay

## 2022-03-29 NOTE — Patient Outreach (Signed)
  Care Coordination   03/29/2022 Name: Todd Delgado MRN: [id number] DOB: June 03, 1960   Care Coordination Outreach Attempts:  An unsuccessful telephone outreach was attempted today to offer the patient information about available care coordination services as a benefit of their health plan.   Follow Up Plan:  Additional outreach attempts will be made to offer the patient care coordination information and services.   Encounter Outcome:  No Answer  Care Coordination Interventions Activated:  No   Care Coordination Interventions:  No, not indicated    Dudley Major RN, BSN,CCM, CDE Care Management Coordinator [facility] Care Management [phone]

## 2022-03-31 DIAGNOSIS — I10 Essential (primary) hypertension: Secondary | ICD-10-CM | POA: Diagnosis not present

## 2022-03-31 DIAGNOSIS — Z794 Long term (current) use of insulin: Secondary | ICD-10-CM | POA: Diagnosis not present

## 2022-03-31 DIAGNOSIS — Z9884 Bariatric surgery status: Secondary | ICD-10-CM | POA: Diagnosis not present

## 2022-03-31 DIAGNOSIS — K912 Postsurgical malabsorption, not elsewhere classified: Secondary | ICD-10-CM | POA: Diagnosis not present

## 2022-03-31 DIAGNOSIS — E1169 Type 2 diabetes mellitus with other specified complication: Secondary | ICD-10-CM | POA: Diagnosis not present

## 2022-04-04 ENCOUNTER — Ambulatory Visit: Payer: BC Managed Care – PPO | Admitting: Internal Medicine

## 2022-04-04 ENCOUNTER — Encounter: Payer: Self-pay | Admitting: Internal Medicine

## 2022-04-04 VITALS — BP 136/84 | HR 66 | Ht 72.0 in | Wt 310.0 lb

## 2022-04-04 DIAGNOSIS — E113299 Type 2 diabetes mellitus with mild nonproliferative diabetic retinopathy without macular edema, unspecified eye: Secondary | ICD-10-CM

## 2022-04-04 DIAGNOSIS — Z794 Long term (current) use of insulin: Secondary | ICD-10-CM | POA: Diagnosis not present

## 2022-04-04 LAB — POCT GLYCOSYLATED HEMOGLOBIN (HGB A1C): Hemoglobin A1C: 6.8 % — AB (ref 4.0–5.6)

## 2022-04-04 MED ORDER — NOVOLOG FLEXPEN 100 UNIT/ML ~~LOC~~ SOPN: PEN_INJECTOR | SUBCUTANEOUS | 3 refills | Status: DC

## 2022-04-04 MED ORDER — DAPAGLIFLOZIN PROPANEDIOL 10 MG PO TABS: 10.0000 mg | ORAL_TABLET | Freq: Every day | ORAL | 3 refills | Status: DC

## 2022-04-04 MED ORDER — INSULIN GLARGINE-YFGN 100 UNIT/ML ~~LOC~~ SOLN: 22.0000 [IU] | Freq: Every day | SUBCUTANEOUS | 3 refills | Status: DC

## 2022-04-04 MED ORDER — TRUEPLUS PEN NEEDLES 31G X 6 MM MISC: 1.0000 | Freq: Four times a day (QID) | 3 refills | Status: DC

## 2022-04-04 MED ORDER — DEXCOM G7 SENSOR MISC: 1.0000 | 3 refills | Status: DC

## 2022-04-04 MED ORDER — TIRZEPATIDE 7.5 MG/0.5ML ~~LOC~~ SOAJ: 7.5000 mg | SUBCUTANEOUS | 3 refills | Status: DC

## 2022-04-04 NOTE — Progress Notes (Unsigned)
Name: Todd Delgado  Age/ Sex: 61 y.o., male   MRN/ DOB: [id number], [birth year]     PCP: Eartha Inch, MD   Reason for Endocrinology Evaluation: Type 2 Diabetes Mellitus  Initial Endocrine Consultative Visit: 12/10/2018    PATIENT IDENTIFIER: [name] is a 61 y.o. male with a past medical history of T2DM, S/P Weight loss sx (04/2017), OSA, HTN and dyslipidemia . The patient has followed with Endocrinology clinic since 12/10/2018 for consultative assistance with management of his diabetes.  DIABETIC HISTORY:  Todd Delgado was diagnosed with T2DM in 1998. He has been on oral glycemic agents in the past with some reported intolerance. (Metformin- unclear, Byetta - halitosis and GI side effects ). His hemoglobin A1c has ranged from 7.0 % in 2018, peaking at 9.0%      Weight Loss :  Had a lap band sx  in 2009 and lost ~50 lbs weight loss. Pt had a revision of Lap Band with Roux-en-Y GB due to plataeu in weigh which results in a ~ 95 lbs loss.     On his initial visit to our clinic his A1c was 7.6%, was on MDI regimen and Invokana.   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     Today (04/04/2022): Todd Delgado is here for a follow up on diabetes care. He checks his blood sugars multiple times daily, through freestyle libre. The patient has had hypoglycemic episodes since the last clinic visit, which typically occur 2 x / week- most often occurring variably    He continues to follow-up with the bariatric clinic in [place] as well as seen the nutritional counselor    Podiatry - Dr. Ludger [facility]ting  ( [facility]    Has not been consistent with [name] due to severe urinary  frequency   Denies nausea, vomiting or diarrhea      HOME DIABETES REGIMEN:  Semglee 22 units daily- takes 24 units  Novolog 8  /6/6 - takes 10 units TID Farxiga 10 mg daily   CF: Novolog (BG-130/25) Tirazepatide 5 mg weekly    CONTINUOUS GLUCOSE MONITORING RECORD INTERPRETATION    Dates of Recording:11/8-11/21/2023  Sensor description: dexcom Results statistics:   CGM use % of time 86  Average and SD 156/45  Time in range  71 %  % Time Above 180 25  % Time above 250 2  % Time Below target <1    Glycemic patterns summary : Optimal Bg's overnight , but hyperglycemia noted during the day   Hyperglycemic episodes  Post-prandial   Hypoglycemic episodes occurred : Rare after bolus  Overnight periods: trends down    DIABETIC COMPLICATIONS: Microvascular complications:   mild NPDR present -requiring  treatment  Denies: neuropathy, CKD Last eye exam: Completed 02/14/2021   Macrovascular complications:    Denies: CAD, PVD, CVA    HISTORY:  Past Medical History:  Past Medical History:  Diagnosis Date   Chest pain    CHF (congestive heart failure) (HCC)    Chronic kidney disease    Diabetes mellitus without complication (HCC)    Hyperlipemia    Hypertension    Impotence    Morbid obesity (HCC)    Post-herpetic polyneuropathy    Sleep apnea    Sleep apnea    Vitamin D deficiency  Past Surgical History:  Past Surgical History:  Procedure Laterality Date   BARIATRIC SURGERY  03/2017   Lap band to Roux-en-Y gastric bypass    LAPAROSCOPIC GASTRIC BANDING  July 2009    Dr. Effie Shy   Social History:  reports that he has never smoked. He has never used smokeless tobacco. He reports that he does not drink alcohol and does not use drugs. Family History:  Family History  Problem Relation Age of Onset   Myasthenia gravis Mother    Heart disease Father    Diabetes Father    Hypertension Father    Heart failure Father    Heart disease Other      HOME MEDICATIONS: Allergies as of 04/04/2022       Reactions   Spironolactone Other (See Comments)   gynecomastia        Medication  List        Accurate as of April 04, 2022  7:18 AM. If you have any questions, ask your nurse or doctor.          5-HTP 100 MG Caps Take by mouth.   amLODipine 5 MG tablet Commonly known as: NORVASC   ascorbic acid 500 MG tablet Commonly known as: VITAMIN C Take 500 mg by mouth daily.   atorvastatin 20 MG tablet Commonly known as: LIPITOR Take 1 tablet (20 mg total) by mouth daily.   b complex vitamins tablet Take 1 tablet by mouth daily.   BIOTIN PO Take 75 mg by mouth.   buPROPion 150 MG 12 hr tablet Commonly known as: ZYBAN Take 150 mg by mouth 2 (two) times daily.   cholecalciferol 1000 units tablet Commonly known as: VITAMIN D Take 2,000 Units by mouth daily.   CINNAMON PO Take 1,000 mg by mouth.   CRANBERRY PO Take by mouth.   Dexcom G6 Sensor Misc 1 Device by Does not apply route as directed.   Dexcom G6 Transmitter Misc 1 Device by Does not apply route as directed.   Farxiga 10 MG Tabs tablet Generic drug: dapagliflozin propanediol TAKE 1 TABLET BY MOUTH EVERY DAY BEFORE BREAKFAST   insulin glargine-yfgn 100 UNIT/ML injection Commonly known as: Semglee (yfgn) Inject 0.22 mLs (22 Units total) into the skin daily.   losartan 50 MG tablet Commonly known as: COZAAR TAKE 1 TABLET(50 MG) BY MOUTH TWICE DAILY AS DIRECTED What changed: See the new instructions.   multivitamin with minerals tablet Take 1 tablet by mouth daily.   Nebivolol HCl 20 MG Tabs Commonly known as: Bystolic Take 1 tablet (20 mg total) by mouth daily.   NovoLOG FlexPen 100 UNIT/ML FlexPen Generic drug: insulin aspart Max daily 50 units   PROBIOTIC DAILY PO Take by mouth daily.   sildenafil 50 MG tablet Commonly known as: VIAGRA Take 50 mg by mouth daily as needed for erectile dysfunction.   tadalafil 20 MG tablet Commonly known as: CIALIS Take 1 tablet (20 mg total) by mouth daily as needed for erectile dysfunction.   tirzepatide 5 MG/0.5ML Pen Commonly  known as: MOUNJARO Inject 5 mg into the skin once a week.   TRUEplus Pen Needles 31G X 6 MM Misc Generic drug: Insulin Pen Needle 1 Device by Does not apply route in the morning, at noon, in the evening, and at bedtime.   UNABLE TO FIND Take by mouth daily. Med Name: MEGA RED  Take as directed.         OBJECTIVE:   Vital Signs: There were no  vitals taken for this visit.  Wt Readings from Last 3 Encounters:  06/10/21 (!) 315 lb (142.9 kg)  03/04/21 (!) 331 lb (150.1 kg)  07/19/20 (!) 315 lb 4 oz (143 kg)     Exam: General: Pt appears well and is in NAD  Lungs: Clear with good BS bilat   Heart: RRR   Extremities: Trace pretibial edema.   Neuro: MS is good with appropriate affect, pt is alert and Ox3     DM foot exam: Per podiatry    DATA REVIEWED:  Lab Results  Component Value Date   HGBA1C 6.7 (A) 06/10/2021   HGBA1C 7.2 (A) 03/04/2021   HGBA1C 6.4 (A) 07/19/2020   03/11/2021 BUN 17 Creatinine 1.08 NA 143 K4.1 Calcium 9.2 AST 23 ALT 23 GFR 79 A1c 7.2% TG 51 HDL 51 LDL 54   ASSESSMENT / PLAN / RECOMMENDATIONS:   1) Type 2 Diabetes Mellitus, Optimally controlled, with retinopathic complications - Most recent A1c of 6.7 %. Goal A1c < 7.0 %.     -Praised the patient on improved glycemic control and weight loss -He is tolerating 1 daughter without side effects, will increase as below -In review of his CGM, he has been noted with tight BG's after supper, will reduce prandial dose for supper -He was given printed prescriptions of all his glycemic agents, his pharmacy has been giving him 9 mL of NovoLog a month instead of 45 mL as the prescription states   MEDICATIONS: Continue Semglee 22 units daily  Change Novolog 8 units with breakfast, 8 units with lunch, 6 units with supper Continue Farxiga 10 mg daily  CF: Novolog ( BG -130/30)  Increase Tirazepatide 5 mg weekly    EDUCATION / INSTRUCTIONS: BG monitoring instructions: Patient is instructed  to check his blood sugars 4 times a day, before meals and bedtime . Call [facility] if: BG persistently < 70  I reviewed the Rule of 15 for the treatment of hypoglycemia in detail with the patient. Literature supplied.      F/U in 6 months   Signed electronically by: Mack Guise, MD  [facility]  [address] [place], [place] [address] Phone: [phone] FAX: [phone]   CC: Chesley Noon, MD [place] [address] Phone: [phone]  Fax: [phone]  Return to Endocrinology clinic as below: Future Appointments  Date Time Provider [place]  04/04/2022 10:50 AM Quantia Grullon, Melanie Crazier, MD [facility] None

## 2022-04-04 NOTE — Patient Instructions (Signed)
-   Decrease Semglee 22 units daily  - Decrease  Novolog 8 units with each meal  - Continue Farxiga 10mg  daily  - Increase Mounjaro 7.5  mg weekly  - Novolog correctional insulin: ADD extra units on insulin to your meal-time Novolog dose if your blood sugars are higher than 160. Use the scale below to help guide you:   Blood sugar before meal Number of units to inject  Less than 160 0 unit  161 -  190 1 units  191 -  220 2 units  221 -  250 3 units  251 -  280 4 units  281 -  310 5 units  311 -  340 6 units  341 - 370  7 units     HOW TO TREAT LOW BLOOD SUGARS (Blood sugar LESS THAN 70 MG/DL) Please follow the RULE OF 15 for the treatment of hypoglycemia treatment (when your (blood sugars are less than 70 mg/dL)   STEP 1: Take 15 grams of carbohydrates when your blood sugar is low, which includes:  3-4 GLUCOSE TABS  OR 3-4 OZ OF JUICE OR REGULAR SODA OR ONE TUBE OF GLUCOSE GEL    STEP 2: RECHECK blood sugar in 15 MINUTES STEP 3: If your blood sugar is still low at the 15 minute recheck --> then, go back to STEP 1 and treat AGAIN with another 15 grams of carbohydrates.

## 2022-04-17 ENCOUNTER — Other Ambulatory Visit: Payer: Self-pay

## 2022-04-17 DIAGNOSIS — E113299 Type 2 diabetes mellitus with mild nonproliferative diabetic retinopathy without macular edema, unspecified eye: Secondary | ICD-10-CM

## 2022-04-17 MED ORDER — NOVOLOG FLEXPEN 100 UNIT/ML ~~LOC~~ SOPN: PEN_INJECTOR | SUBCUTANEOUS | 3 refills | Status: DC

## 2022-04-17 MED ORDER — INSULIN GLARGINE-YFGN 100 UNIT/ML ~~LOC~~ SOLN: 22.0000 [IU] | Freq: Every day | SUBCUTANEOUS | 3 refills | Status: DC

## 2022-04-17 MED ORDER — DAPAGLIFLOZIN PROPANEDIOL 10 MG PO TABS: 10.0000 mg | ORAL_TABLET | Freq: Every day | ORAL | 3 refills | Status: DC

## 2022-04-17 MED ORDER — DEXCOM G7 SENSOR MISC: 1.0000 | 3 refills | Status: DC

## 2022-04-17 MED ORDER — TIRZEPATIDE 7.5 MG/0.5ML ~~LOC~~ SOAJ: 7.5000 mg | SUBCUTANEOUS | 3 refills | Status: DC

## 2022-04-18 ENCOUNTER — Telehealth: Payer: Self-pay

## 2022-04-18 NOTE — Patient Outreach (Signed)
  Care Coordination   04/18/2022 Name: Todd Delgado MRN: [id number] DOB: 1961-03-21   Care Coordination Outreach Attempts:  A second unsuccessful outreach was attempted today to offer the patient with information about available care coordination services as a benefit of their health plan.     Follow Up Plan:  Additional outreach attempts will be made to offer the patient care coordination information and services.   Encounter Outcome:  No Answer   Care Coordination Interventions:  No, not indicated    Dudley Major RN, BSN,CCM, CDE Care Management Coordinator [facility] Care Management [phone]

## 2022-04-20 DIAGNOSIS — E113291 Type 2 diabetes mellitus with mild nonproliferative diabetic retinopathy without macular edema, right eye: Secondary | ICD-10-CM | POA: Diagnosis not present

## 2022-04-21 DIAGNOSIS — M2141 Flat foot [pes planus] (acquired), right foot: Secondary | ICD-10-CM | POA: Diagnosis not present

## 2022-04-21 DIAGNOSIS — E0840 Diabetes mellitus due to underlying condition with diabetic neuropathy, unspecified: Secondary | ICD-10-CM | POA: Diagnosis not present

## 2022-04-21 DIAGNOSIS — B351 Tinea unguium: Secondary | ICD-10-CM | POA: Diagnosis not present

## 2022-04-24 DIAGNOSIS — Z9884 Bariatric surgery status: Secondary | ICD-10-CM | POA: Diagnosis not present

## 2022-04-24 DIAGNOSIS — Z713 Dietary counseling and surveillance: Secondary | ICD-10-CM | POA: Diagnosis not present

## 2022-04-26 ENCOUNTER — Telehealth: Payer: Self-pay

## 2022-04-26 NOTE — Patient Outreach (Signed)
  Care Coordination   04/26/2022 Name: Todd Delgado MRN: [id number] DOB: July 05, 1960   Care Coordination Outreach Attempts:  A third unsuccessful outreach was attempted today to offer the patient with information about available care coordination services as a benefit of their health plan.   Follow Up Plan:  No further outreach attempts will be made at this time. We have been unable to contact the patient to offer or enroll patient in care coordination services  Encounter Outcome:  No Answer   Care Coordination Interventions:  No, not indicated   Dudley Major RN, BSN,CCM, CDE Care Management Coordinator [facility] Care Management [phone]

## 2022-04-28 DIAGNOSIS — G4733 Obstructive sleep apnea (adult) (pediatric): Secondary | ICD-10-CM | POA: Diagnosis not present

## 2022-05-04 DIAGNOSIS — G4733 Obstructive sleep apnea (adult) (pediatric): Secondary | ICD-10-CM | POA: Diagnosis not present

## 2022-05-25 DIAGNOSIS — Z9884 Bariatric surgery status: Secondary | ICD-10-CM | POA: Diagnosis not present

## 2022-05-25 DIAGNOSIS — I10 Essential (primary) hypertension: Secondary | ICD-10-CM | POA: Diagnosis not present

## 2022-05-25 DIAGNOSIS — G4733 Obstructive sleep apnea (adult) (pediatric): Secondary | ICD-10-CM | POA: Diagnosis not present

## 2022-05-25 DIAGNOSIS — E1169 Type 2 diabetes mellitus with other specified complication: Secondary | ICD-10-CM | POA: Diagnosis not present

## 2022-05-25 DIAGNOSIS — E785 Hyperlipidemia, unspecified: Secondary | ICD-10-CM | POA: Diagnosis not present

## 2022-06-01 ENCOUNTER — Other Ambulatory Visit: Payer: Self-pay | Admitting: Internal Medicine

## 2022-06-27 DIAGNOSIS — R350 Frequency of micturition: Secondary | ICD-10-CM | POA: Diagnosis not present

## 2022-06-27 DIAGNOSIS — Z794 Long term (current) use of insulin: Secondary | ICD-10-CM | POA: Diagnosis not present

## 2022-06-27 DIAGNOSIS — E1169 Type 2 diabetes mellitus with other specified complication: Secondary | ICD-10-CM | POA: Diagnosis not present

## 2022-06-27 DIAGNOSIS — I10 Essential (primary) hypertension: Secondary | ICD-10-CM | POA: Diagnosis not present

## 2022-07-24 DIAGNOSIS — Z713 Dietary counseling and surveillance: Secondary | ICD-10-CM | POA: Diagnosis not present

## 2022-08-18 DIAGNOSIS — E0840 Diabetes mellitus due to underlying condition with diabetic neuropathy, unspecified: Secondary | ICD-10-CM | POA: Diagnosis not present

## 2022-08-18 DIAGNOSIS — M79675 Pain in left toe(s): Secondary | ICD-10-CM | POA: Diagnosis not present

## 2022-08-18 DIAGNOSIS — B351 Tinea unguium: Secondary | ICD-10-CM | POA: Diagnosis not present

## 2022-10-06 ENCOUNTER — Ambulatory Visit: Payer: BC Managed Care – PPO | Admitting: Internal Medicine

## 2022-10-06 ENCOUNTER — Encounter: Payer: Self-pay | Admitting: Internal Medicine

## 2022-10-06 VITALS — BP 140/86 | HR 90 | Ht 72.0 in | Wt 301.0 lb

## 2022-10-06 DIAGNOSIS — Z794 Long term (current) use of insulin: Secondary | ICD-10-CM

## 2022-10-06 DIAGNOSIS — E113299 Type 2 diabetes mellitus with mild nonproliferative diabetic retinopathy without macular edema, unspecified eye: Secondary | ICD-10-CM

## 2022-10-06 DIAGNOSIS — R350 Frequency of micturition: Secondary | ICD-10-CM | POA: Diagnosis not present

## 2022-10-06 LAB — POCT GLYCOSYLATED HEMOGLOBIN (HGB A1C): Hemoglobin A1C: 6.8 % — AB (ref 4.0–5.6)

## 2022-10-06 MED ORDER — TAMSULOSIN HCL 0.4 MG PO CAPS: 0.4000 mg | ORAL_CAPSULE | Freq: Every day | ORAL | 0 refills | Status: DC

## 2022-10-06 NOTE — Patient Instructions (Addendum)
-   Continue Semglee 22 units daily  - Change  Novolog 8 units with Breakfast and Lunch and 6 units with Supper  - Continue Farxiga mg daily   - Continue  Mounjaro 7.5  mg weekly  - Novolog correctional insulin: ADD extra units on insulin to your meal-time Novolog dose if your blood sugars are higher than 160. Use the scale below to help guide you:   Blood sugar before meal Number of units to inject  Less than 160 0 unit  161 -  190 1 units  191 -  220 2 units  221 -  250 3 units  251 -  280 4 units  281 -  310 5 units  311 -  340 6 units  341 - 370  7 units     HOW TO TREAT LOW BLOOD SUGARS (Blood sugar LESS THAN 70 MG/DL) Please follow the RULE OF 15 for the treatment of hypoglycemia treatment (when your (blood sugars are less than 70 mg/dL)   STEP 1: Take 15 grams of carbohydrates when your blood sugar is low, which includes:  3-4 GLUCOSE TABS  OR 3-4 OZ OF JUICE OR REGULAR SODA OR ONE TUBE OF GLUCOSE GEL    STEP 2: RECHECK blood sugar in 15 MINUTES STEP 3: If your blood sugar is still low at the 15 minute recheck --> then, go back to STEP 1 and treat AGAIN with another 15 grams of carbohydrates.

## 2022-10-06 NOTE — Progress Notes (Signed)
Name: Todd Delgado  Age/ Sex: 62 y.o., male   MRN/ DOB: [id number], [birth year]     PCP: Eartha Inch, MD   Reason for Endocrinology Evaluation: Type 2 Diabetes Mellitus  Initial Endocrine Consultative Visit: 12/10/2018    PATIENT IDENTIFIER: Todd Delgado is a 62 y.o. male with a past medical history of T2DM, S/P Weight loss sx (04/2017), OSA, HTN and dyslipidemia . The patient has followed with Endocrinology clinic since 12/10/2018 for consultative assistance with management of his diabetes.  DIABETIC HISTORY:  Mr. Werthman was diagnosed with T2DM in 1998. He has been on oral glycemic agents in the past with some reported intolerance. (Metformin- unclear, Byetta - halitosis and GI side effects ). His hemoglobin A1c has ranged from 7.0 % in 2018, peaking at 9.0%      Weight Loss :  Had a lap band sx  in 2009 and lost ~50 lbs weight loss. Pt had a revision of Lap Band with Roux-en-Y GB due to plataeu in weigh which results in a ~ 95 lbs loss.     On his initial visit to our clinic his A1c was 7.6%, was on MDI regimen and Invokana.   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     Today (10/06/2022): Mr. Boerboom is here for a follow up on diabetes care. He checks his blood sugars multiple times daily, through freestyle libre. The patient has had hypoglycemic episodes since the last clinic visit, which typically occur 2 x / week- most often occurring variably    He continues to follow-up with the bariatric clinic in [place] as well as seen the nutritional counselor   Podiatry - Dr. Ludger Nutting  ( [facility] )    Continue with urinary frequency  Farxiga due to severe urinary  frequency Nocturia once daily    Denies nausea, vomiting  Denies constipation or  diarrhea     HOME DIABETES REGIMEN:  Farxiga 10 mg daily Mounjaro 7.5 mg weekly Semglee 22 units  daily Novolog 8 units TID CF: Novolog (BG-130/25)    CONTINUOUS GLUCOSE MONITORING RECORD INTERPRETATION    Dates of Recording:5/11-5/24/2024  Sensor description: dexcom Results statistics:   CGM use % of time 93  Average and SD 155/47  Time in range 71 %  % Time Above 180 23  % Time above 250 4  % Time Below target <1    Glycemic patterns summary : Optimal Bg's overnight , and fluctuate during the day   Hyperglycemic episodes  Post-prandial   Hypoglycemic episodes occurred : after supper   Overnight periods: stable   DIABETIC COMPLICATIONS: Microvascular complications:   mild NPDR present -requiring  treatment  Denies: neuropathy, CKD Last eye exam: Completed 02/14/2021   Macrovascular complications:    Denies: CAD, PVD, CVA    HISTORY:  Past Medical History:  Past Medical History:  Diagnosis Date   Chest pain    CHF (congestive heart failure) (HCC)    Chronic kidney disease    Diabetes mellitus without complication (HCC)    Hyperlipemia    Hypertension    Impotence    Morbid obesity (HCC)    Post-herpetic polyneuropathy    Sleep apnea    Sleep apnea    Vitamin D deficiency    Past Surgical History:  Past Surgical History:  Procedure Laterality Date   BARIATRIC SURGERY  03/2017   Lap band to Roux-en-Y gastric bypass    LAPAROSCOPIC GASTRIC BANDING  July 2009    Dr. Grier Mitts   Social History:  reports that he has never smoked. He has never used smokeless tobacco. He reports that he does not drink alcohol and does not use drugs. Family History:  Family History  Problem Relation Age of Onset   Myasthenia gravis Mother    Heart disease Father    Diabetes Father    Hypertension Father    Heart failure Father    Heart disease Other      HOME MEDICATIONS: Allergies as of 10/06/2022       Reactions   Spironolactone Other (See Comments)   gynecomastia        Medication List        Accurate as of Oct 06, 2022  4:03 PM. If you have any  questions, ask your nurse or doctor.          5-HTP 100 MG Caps Take by mouth.   amLODipine 5 MG tablet Commonly known as: NORVASC   ascorbic acid 500 MG tablet Commonly known as: VITAMIN C Take 500 mg by mouth daily.   atorvastatin 20 MG tablet Commonly known as: LIPITOR Take 1 tablet (20 mg total) by mouth daily.   b complex vitamins tablet Take 1 tablet by mouth daily.   BIOTIN PO Take 75 mg by mouth.   buPROPion 150 MG 12 hr tablet Commonly known as: ZYBAN Take 150 mg by mouth 2 (two) times daily.   cholecalciferol 1000 units tablet Commonly known as: VITAMIN D Take 2,000 Units by mouth daily.   CINNAMON PO Take 1,000 mg by mouth.   CRANBERRY PO Take by mouth.   dapagliflozin propanediol 10 MG Tabs tablet Commonly known as: Farxiga Take 1 tablet (10 mg total) by mouth daily.   Dexcom G7 Sensor Misc 1 Device by Does not apply route as directed.   insulin glargine-yfgn 100 UNIT/ML injection Commonly known as: Semglee (yfgn) Inject 0.22 mLs (22 Units total) into the skin daily.   losartan 50 MG tablet Commonly known as: COZAAR TAKE 1 TABLET(50 MG) BY MOUTH TWICE DAILY AS DIRECTED What changed: See the new instructions.   multivitamin with minerals tablet Take 1 tablet by mouth daily.   Nebivolol HCl 20 MG Tabs Commonly known as: Bystolic Take 1 tablet (20 mg total) by mouth daily.   NovoLOG FlexPen 100 UNIT/ML FlexPen Generic drug: insulin aspart Max daily 50 units   PROBIOTIC DAILY PO Take by mouth daily.   sildenafil 50 MG tablet Commonly known as: VIAGRA Take 50 mg by mouth daily as needed for erectile dysfunction.   tadalafil 20 MG tablet Commonly known as: CIALIS Take 1 tablet (20 mg total) by mouth daily as needed for erectile dysfunction.   tamsulosin 0.4 MG Caps capsule Commonly known as: FLOMAX Take 1 capsule (0.4 mg total) by mouth at bedtime. Started by: Scarlette Shorts, MD   terbinafine 250 MG tablet Commonly known  as: LAMISIL Take 250 mg by mouth daily.   tirzepatide 7.5 MG/0.5ML Pen Commonly known as: MOUNJARO Inject 7.5 mg into the skin once a week.   TRUEplus Pen Needles 31G X 6 MM Misc Generic drug: Insulin Pen Needle 1 Device by Does not apply route in the morning, at noon, in the evening, and at bedtime.   TRUEplus 5-Bevel Pen Needles 31G X 5 MM Misc Generic drug: Insulin Pen Needle USE AS DIRECTED EVERY  MORNING, AT NOON, EVERY EVENING AND AT BEDTIME   UNABLE TO FIND Take by mouth daily. Med Name: MEGA RED  Take as directed.         OBJECTIVE:   Vital Signs: BP (!) 140/86 (BP Location: Left Arm, Patient Position: Sitting, Cuff Size: Large)   Pulse 90   Ht 6' (1.829 m)   Wt (!) 301 lb (136.5 kg)   SpO2 96%   BMI 40.82 kg/m   Wt Readings from Last 3 Encounters:  10/06/22 (!) 301 lb (136.5 kg)  04/04/22 (!) 310 lb (140.6 kg)  06/10/21 (!) 315 lb (142.9 kg)     Exam: General: Pt appears well and is in NAD  Lungs: Clear with good BS bilat   Heart: RRR   Extremities: Trace pretibial edema.   Neuro: MS is good with appropriate affect, pt is alert and Ox3     DM foot exam: Per podiatry 06/2022    DATA REVIEWED:  Lab Results  Component Value Date   HGBA1C 6.8 (A) 10/06/2022   HGBA1C 6.8 (A) 04/04/2022   HGBA1C 6.7 (A) 06/10/2021   06/27/2022 BUN 16 Creatinine 1.10 K4.6 Calcium 9.3 AST 26 ALT 25 GFR 76  03/03/2022 TG 51 HDL 51 LDL 54   ASSESSMENT / PLAN / RECOMMENDATIONS:   1) Type 2 Diabetes Mellitus, Optimally controlled, with retinopathic complications - Most recent A1c of 6.8 %. Goal A1c < 7.0 %.     -A1c continues to be optimal -He continues to hold [place] on days that he has meeting due to urinary frequency.  I had considered BPH as a differential, his PCP did PSA and was told his test looked normal (no rectal exam was done) -We did discuss increasing Mounjaro, but he opted to remain on the current dose at this time, he will contact me if he  needs this to be increased in the future.  On the last increase, the patient did have GI side effects -In reviewing his CGM, the patient has been noted with tight/hypoglycemia after supper, will reduce NovoLog for that time  MEDICATIONS: Continue Semglee 22 units daily  Change  Novolog 8/8/6 units  Continue Farxiga 10 mg daily  CF: Novolog ( BG -130/30)  Continue Tirazepatide 7.5 mg weekly    EDUCATION / INSTRUCTIONS: BG monitoring instructions: Patient is instructed to check his blood sugars 4 times a day, before meals and bedtime . Call [facility] if: BG persistently < 70  I reviewed the Rule of 15 for the treatment of hypoglycemia in detail with the patient. Literature supplied.   2) Urinary Frequency :  -It is unclear if this is related to [place] versus BPH -I have prescribed tamsulosin 0.4 mg to be taken at bedtime for 1 month, patient will notify me should this help and he needs a long-term refill    F/U in 6 months     Signed electronically by: Lyndle Herrlich, MD  [facility]  [facility] [address] [place] [place], [place] [address] Phone: [phone] FAX: [phone]   CC: Eartha Inch, MD [address] [place] [address] Phone: [phone]  Fax: [phone]  Return to Endocrinology clinic as below: Future Appointments  Date Time Provider Department Center  04/06/2023 12:10 PM Leni Pankonin, Konrad Dolores, MD [facility] None

## 2022-10-20 DIAGNOSIS — B351 Tinea unguium: Secondary | ICD-10-CM | POA: Diagnosis not present

## 2022-10-20 DIAGNOSIS — E0840 Diabetes mellitus due to underlying condition with diabetic neuropathy, unspecified: Secondary | ICD-10-CM | POA: Diagnosis not present

## 2022-10-20 DIAGNOSIS — M79675 Pain in left toe(s): Secondary | ICD-10-CM | POA: Diagnosis not present

## 2022-11-05 DIAGNOSIS — G4733 Obstructive sleep apnea (adult) (pediatric): Secondary | ICD-10-CM | POA: Diagnosis not present

## 2023-01-19 DIAGNOSIS — M79675 Pain in left toe(s): Secondary | ICD-10-CM | POA: Diagnosis not present

## 2023-01-19 DIAGNOSIS — E0840 Diabetes mellitus due to underlying condition with diabetic neuropathy, unspecified: Secondary | ICD-10-CM | POA: Diagnosis not present

## 2023-01-19 DIAGNOSIS — B351 Tinea unguium: Secondary | ICD-10-CM | POA: Diagnosis not present

## 2023-02-02 ENCOUNTER — Other Ambulatory Visit: Payer: Self-pay | Admitting: Internal Medicine

## 2023-03-02 ENCOUNTER — Other Ambulatory Visit: Payer: Self-pay | Admitting: Internal Medicine

## 2023-03-12 DIAGNOSIS — K912 Postsurgical malabsorption, not elsewhere classified: Secondary | ICD-10-CM | POA: Diagnosis not present

## 2023-03-12 DIAGNOSIS — Z794 Long term (current) use of insulin: Secondary | ICD-10-CM | POA: Diagnosis not present

## 2023-03-12 DIAGNOSIS — E1169 Type 2 diabetes mellitus with other specified complication: Secondary | ICD-10-CM | POA: Diagnosis not present

## 2023-03-12 DIAGNOSIS — Z9884 Bariatric surgery status: Secondary | ICD-10-CM | POA: Diagnosis not present

## 2023-03-16 DIAGNOSIS — E1142 Type 2 diabetes mellitus with diabetic polyneuropathy: Secondary | ICD-10-CM | POA: Diagnosis not present

## 2023-03-16 DIAGNOSIS — I10 Essential (primary) hypertension: Secondary | ICD-10-CM | POA: Diagnosis not present

## 2023-03-16 DIAGNOSIS — Z9884 Bariatric surgery status: Secondary | ICD-10-CM | POA: Diagnosis not present

## 2023-03-16 DIAGNOSIS — Z0000 Encounter for general adult medical examination without abnormal findings: Secondary | ICD-10-CM | POA: Diagnosis not present

## 2023-03-16 DIAGNOSIS — E1169 Type 2 diabetes mellitus with other specified complication: Secondary | ICD-10-CM | POA: Diagnosis not present

## 2023-03-20 ENCOUNTER — Other Ambulatory Visit: Payer: Self-pay

## 2023-03-20 DIAGNOSIS — E113299 Type 2 diabetes mellitus with mild nonproliferative diabetic retinopathy without macular edema, unspecified eye: Secondary | ICD-10-CM

## 2023-03-20 MED ORDER — NOVOLOG FLEXPEN 100 UNIT/ML ~~LOC~~ SOPN: PEN_INJECTOR | SUBCUTANEOUS | 3 refills | Status: DC

## 2023-03-21 ENCOUNTER — Other Ambulatory Visit: Payer: Self-pay

## 2023-03-21 DIAGNOSIS — Z9884 Bariatric surgery status: Secondary | ICD-10-CM | POA: Diagnosis not present

## 2023-03-21 DIAGNOSIS — E1169 Type 2 diabetes mellitus with other specified complication: Secondary | ICD-10-CM | POA: Diagnosis not present

## 2023-03-21 DIAGNOSIS — E113299 Type 2 diabetes mellitus with mild nonproliferative diabetic retinopathy without macular edema, unspecified eye: Secondary | ICD-10-CM

## 2023-03-21 DIAGNOSIS — K912 Postsurgical malabsorption, not elsewhere classified: Secondary | ICD-10-CM | POA: Diagnosis not present

## 2023-03-21 DIAGNOSIS — I10 Essential (primary) hypertension: Secondary | ICD-10-CM | POA: Diagnosis not present

## 2023-03-21 DIAGNOSIS — E785 Hyperlipidemia, unspecified: Secondary | ICD-10-CM | POA: Diagnosis not present

## 2023-03-21 DIAGNOSIS — G4733 Obstructive sleep apnea (adult) (pediatric): Secondary | ICD-10-CM | POA: Diagnosis not present

## 2023-03-21 MED ORDER — DEXCOM G7 SENSOR MISC: 1.0000 | 3 refills | Status: DC

## 2023-03-30 DIAGNOSIS — M79675 Pain in left toe(s): Secondary | ICD-10-CM | POA: Diagnosis not present

## 2023-03-30 DIAGNOSIS — E0840 Diabetes mellitus due to underlying condition with diabetic neuropathy, unspecified: Secondary | ICD-10-CM | POA: Diagnosis not present

## 2023-03-30 DIAGNOSIS — B351 Tinea unguium: Secondary | ICD-10-CM | POA: Diagnosis not present

## 2023-04-06 ENCOUNTER — Encounter: Payer: Self-pay | Admitting: Internal Medicine

## 2023-04-06 ENCOUNTER — Ambulatory Visit: Payer: BC Managed Care – PPO | Admitting: Internal Medicine

## 2023-04-06 VITALS — BP 124/80 | HR 68 | Ht 72.0 in | Wt 307.0 lb

## 2023-04-06 DIAGNOSIS — N400 Enlarged prostate without lower urinary tract symptoms: Secondary | ICD-10-CM | POA: Diagnosis not present

## 2023-04-06 DIAGNOSIS — R7989 Other specified abnormal findings of blood chemistry: Secondary | ICD-10-CM | POA: Diagnosis not present

## 2023-04-06 DIAGNOSIS — E113299 Type 2 diabetes mellitus with mild nonproliferative diabetic retinopathy without macular edema, unspecified eye: Secondary | ICD-10-CM

## 2023-04-06 DIAGNOSIS — Z794 Long term (current) use of insulin: Secondary | ICD-10-CM

## 2023-04-06 LAB — POCT GLYCOSYLATED HEMOGLOBIN (HGB A1C): Hemoglobin A1C: 6.8 % — AB (ref 4.0–5.6)

## 2023-04-06 MED ORDER — DEXCOM G7 SENSOR MISC: 1.0000 | 3 refills | Status: DC

## 2023-04-06 MED ORDER — INSULIN GLARGINE-YFGN 100 UNIT/ML ~~LOC~~ SOLN: 18.0000 [IU] | Freq: Every day | SUBCUTANEOUS | 3 refills | Status: DC

## 2023-04-06 MED ORDER — TRUEPLUS PEN NEEDLES 31G X 6 MM MISC: 1.0000 | Freq: Four times a day (QID) | 3 refills | Status: AC

## 2023-04-06 MED ORDER — TIRZEPATIDE 10 MG/0.5ML ~~LOC~~ SOAJ: 10.0000 mg | SUBCUTANEOUS | 3 refills | Status: DC

## 2023-04-06 MED ORDER — DAPAGLIFLOZIN PROPANEDIOL 10 MG PO TABS: 10.0000 mg | ORAL_TABLET | Freq: Every day | ORAL | 3 refills | Status: AC

## 2023-04-06 MED ORDER — TAMSULOSIN HCL 0.4 MG PO CAPS: 0.4000 mg | ORAL_CAPSULE | Freq: Every day | ORAL | 3 refills | Status: DC

## 2023-04-06 NOTE — Patient Instructions (Addendum)
-   Decrease  Semglee 18 units daily  - Continue   Novolog 8 units with Breakfast and Lunch and 6 units with Supper  - Continue Farxiga 10 mg daily   - Increase Mounjaro 10  mg weekly  - Novolog correctional insulin: ADD extra units on insulin to your meal-time Novolog dose if your blood sugars are higher than 160. Use the scale below to help guide you:   Blood sugar before meal Number of units to inject  Less than 160 0 unit  161 -  190 1 units  191 -  220 2 units  221 -  250 3 units  251 -  280 4 units  281 -  310 5 units  311 -  340 6 units  341 - 370  7 units     HOW TO TREAT LOW BLOOD SUGARS (Blood sugar LESS THAN 70 MG/DL) Please follow the RULE OF 15 for the treatment of hypoglycemia treatment (when your (blood sugars are less than 70 mg/dL)   STEP 1: Take 15 grams of carbohydrates when your blood sugar is low, which includes:  3-4 GLUCOSE TABS  OR 3-4 OZ OF JUICE OR REGULAR SODA OR ONE TUBE OF GLUCOSE GEL    STEP 2: RECHECK blood sugar in 15 MINUTES STEP 3: If your blood sugar is still low at the 15 minute recheck --> then, go back to STEP 1 and treat AGAIN with another 15 grams of carbohydrates.

## 2023-04-06 NOTE — Progress Notes (Signed)
Name: Todd Delgado  Age/ Sex: 62 y.o., male   MRN/ DOB: [id number], 31-Jan-1961     PCP: Eartha Inch, MD   Reason for Endocrinology Evaluation: Type 2 Diabetes Mellitus  Initial Endocrine Consultative Visit: 12/10/2018    PATIENT IDENTIFIER: Todd Delgado is a 62 y.o. male with a past medical history of T2DM, S/P Weight loss sx (04/2017), OSA, HTN and dyslipidemia . The patient has followed with Endocrinology clinic since 12/10/2018 for consultative assistance with management of his diabetes.  DIABETIC HISTORY:  Todd Delgado was diagnosed with T2DM in 1998. He has been on oral glycemic agents in the past with some reported intolerance. (Metformin- unclear, Byetta - halitosis and GI side effects ). His hemoglobin A1c has ranged from 7.0 % in 2018, peaking at 9.0%      Weight Loss :  Had a lap band sx  in 2009 and lost ~50 lbs weight loss. Pt had a revision of Lap Band with Roux-en-Y GB due to plataeu in weigh which results in a ~ 95 lbs loss.     On his initial visit to our clinic his A1c was 7.6%, was on MDI regimen and Invokana.   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     Today (04/06/2023): Todd Delgado is here for a follow up on diabetes care. He checks his blood sugars multiple times daily, through CGM. The patient has had hypoglycemic episodes since the last clinic visit, which typically occur at night    He continues to follow-up with the bariatric clinic in [place] as well as seen the nutritional counselor   Podiatry - Dr. Ludger [facility]  ( [facility]   His frequency has improved with tamsulosin Denies nausea, vomiting  Denies constipation or  diarrhea     HOME DIABETES REGIMEN:  Farxiga 10 mg daily Mounjaro 7.5 mg weekly Semglee 22 units daily Novolog 8/8/6 units  CF: Novolog (BG-130/30) Tamsulosin 0.4 mg bedtime   CONTINUOUS GLUCOSE  MONITORING RECORD INTERPRETATION    Dates of Recording: 11/9-11/22/2024  Sensor description: dexcom Results statistics:   CGM use % of time 93  Average and SD 153/41  Time in range 79 %  % Time Above 180 19  % Time above 250 2  % Time Below target 0    Glycemic patterns summary : Optimal Bg's overnight but trend up during the day Hyperglycemic episodes  Post-prandial   Hypoglycemic episodes occurred : after supper   Overnight periods: Trends down     DIABETIC COMPLICATIONS: Microvascular complications:   mild NPDR present -requiring  treatment  Denies: neuropathy, CKD Last eye exam: Completed 02/14/2021   Macrovascular complications:    Denies: CAD, PVD, CVA    HISTORY:  Past Medical History:  Past Medical History:  Diagnosis Date   Chest pain    CHF (congestive heart failure) (HCC)    Chronic kidney disease    Diabetes mellitus without complication (HCC)    Hyperlipemia    Hypertension    Impotence    Morbid obesity (HCC)    Post-herpetic polyneuropathy    Sleep apnea    Sleep apnea    Vitamin D deficiency    Past Surgical History:  Past Surgical History:  Procedure Laterality Date   BARIATRIC SURGERY  03/2017   Lap band to Roux-en-Y gastric bypass    LAPAROSCOPIC GASTRIC BANDING  July 2009    Dr. Grier Mitts   Social History:  reports that he has never smoked. He has never used smokeless tobacco. He reports that he does not drink alcohol and does not use drugs. Family History:  Family History  Problem Relation Age of Onset   Myasthenia gravis Mother    Heart disease Father    Diabetes Father    Hypertension Father    Heart failure Father    Heart disease Other      HOME MEDICATIONS: Allergies as of 04/06/2023       Reactions   Spironolactone Other (See Comments)   gynecomastia        Medication List        Accurate as of April 06, 2023 12:01 PM. If you have any questions, ask your nurse or doctor.          5-HTP 100 MG  Caps Take by mouth.   amLODipine 5 MG tablet Commonly known as: NORVASC   amLODipine 10 MG tablet Commonly known as: NORVASC Take 10 mg by mouth daily.   ascorbic acid 500 MG tablet Commonly known as: VITAMIN C Take 500 mg by mouth daily.   atorvastatin 20 MG tablet Commonly known as: LIPITOR Take 1 tablet (20 mg total) by mouth daily.   b complex vitamins tablet Take 1 tablet by mouth daily.   BIOTIN PO Take 75 mg by mouth.   buPROPion 150 MG 12 hr tablet Commonly known as: ZYBAN Take 150 mg by mouth 2 (two) times daily.   buPROPion 300 MG 24 hr tablet Commonly known as: WELLBUTRIN XL Take 300 mg by mouth daily.   cholecalciferol 1000 units tablet Commonly known as: VITAMIN D Take 2,000 Units by mouth daily.   CINNAMON PO Take 1,000 mg by mouth.   cloNIDine 0.3 mg/24hr patch Commonly known as: CATAPRES - Dosed in mg/24 hr Place 0.3 mg onto the skin once a week.   CRANBERRY PO Take by mouth.   dapagliflozin propanediol 10 MG Tabs tablet Commonly known as: Farxiga Take 1 tablet (10 mg total) by mouth daily.   Dexcom G7 Sensor Misc 1 Device by Does not apply route as directed.   insulin glargine-yfgn 100 UNIT/ML injection Commonly known as: Semglee (yfgn) Inject 0.22 mLs (22 Units total) into the skin daily.   losartan 50 MG tablet Commonly known as: COZAAR TAKE 1 TABLET(50 MG) BY MOUTH TWICE DAILY AS DIRECTED What changed: See the new instructions.   Mounjaro 7.5 MG/0.5ML Pen Generic drug: tirzepatide Inject 7.5 mg under the skin once weekly.   multivitamin with minerals tablet Take 1 tablet by mouth daily.   Nebivolol HCl 20 MG Tabs Commonly known as: Bystolic Take 1 tablet (20 mg total) by mouth daily.   NovoLOG FlexPen 100 UNIT/ML FlexPen Generic drug: insulin aspart Max daily 50 units   PROBIOTIC DAILY PO Take by mouth daily.   sildenafil 50 MG tablet Commonly known as: VIAGRA Take 50 mg by mouth daily as needed for erectile  dysfunction.   tadalafil 20 MG tablet Commonly known as: CIALIS Take 1 tablet (20 mg total) by mouth daily as needed for erectile dysfunction.   tamsulosin 0.4 MG Caps capsule Commonly known as: FLOMAX TAKE 1 CAPSULE(0.4 MG) BY MOUTH AT BEDTIME   terbinafine 250 MG tablet Commonly known as: LAMISIL Take 250 mg by mouth daily.   TRUEplus Pen Needles 31G X 6 MM Misc Generic drug: Insulin Pen Needle 1 Device by Does not apply  route in the morning, at noon, in the evening, and at bedtime.   TRUEplus 5-Bevel Pen Needles 31G X 5 MM Misc Generic drug: Insulin Pen Needle USE AS DIRECTED EVERY MORNING, AT NOON, EVERY EVENING AND AT BEDTIME   UNABLE TO FIND Take by mouth daily. Med Name: MEGA RED  Take as directed.         OBJECTIVE:   Vital Signs: BP 124/80 (BP Location: Left Arm, Patient Position: Sitting, Cuff Size: Large)   Pulse 68   Ht 6' (1.829 m)   Wt (!) 307 lb (139.3 kg)   SpO2 99%   BMI 41.64 kg/m   Wt Readings from Last 3 Encounters:  04/06/23 (!) 307 lb (139.3 kg)  10/06/22 (!) 301 lb (136.5 kg)  04/04/22 (!) 310 lb (140.6 kg)     Exam: General: Pt appears well and is in NAD  Lungs: Clear with good BS bilat   Heart: RRR   Extremities: 1+  pretibial edema.   Neuro: MS is good with appropriate affect, pt is alert and Ox3     DM foot exam: Per podiatry 02/2023    DATA REVIEWED:  Lab Results  Component Value Date   HGBA1C 6.8 (A) 10/06/2022   HGBA1C 6.8 (A) 04/04/2022   HGBA1C 6.7 (A) 06/10/2021   03/12/2023 BUN 18 Creatinine 1.29 GFR 63 Tg 63 HDL 50 LDL 60    ASSESSMENT / PLAN / RECOMMENDATIONS:   1) Type 2 Diabetes Mellitus, Optimally controlled, with retinopathic complications - Most recent A1c of 6.8 %. Goal A1c < 7.0 %.     -A1c continues to be optimal -In reviewing CGM data, patient has been noted with hypoglycemia overnight, will decrease Semglee -Will increase Mounjaro as below -He continues to hold [place] but not as often  due to urinary frequency, tamsulosin has helped with the frequency    MEDICATIONS: Continue Farxiga 10 mg daily Increase Mounjaro 10 mg weekly Decrease Semglee 18  units daily  Continue Novolog 8/8/6 units  CF: Novolog ( BG -130/30)     EDUCATION / INSTRUCTIONS: BG monitoring instructions: Patient is instructed to check his blood sugars 4 times a day, before meals and bedtime . Call [facility] if: BG persistently < 70  I reviewed the Rule of 15 for the treatment of hypoglycemia in detail with the patient. Literature supplied.   2) BPH:  -Tamsulosin has helped with urinary frequency -Will continue  Medication Continue tamsulosin 0.4 mg bedtime   3) Elevated Creatinine:  -This was noted as mostly recent lab work, encouraged hydration, avoiding NSAIDs and using Tylenol instead is much as possible  F/U in 6 months     Signed electronically by: Lyndle Herrlich, MD  [facility] Endocrinology  [facility] [address] [place] [place], [place] [address] Phone: [phone] FAX: [phone]   CC: Eartha Inch, MD [address] [place] [address] Phone: [phone]  Fax: [phone]  Return to Endocrinology clinic as below: Future Appointments  Date Time Provider Department Center  04/06/2023 12:10 PM Bridgit Eynon, Konrad Dolores, MD [id number] None

## 2023-04-16 DIAGNOSIS — I10 Essential (primary) hypertension: Secondary | ICD-10-CM | POA: Diagnosis not present

## 2023-04-16 DIAGNOSIS — G4733 Obstructive sleep apnea (adult) (pediatric): Secondary | ICD-10-CM | POA: Diagnosis not present

## 2023-05-06 DIAGNOSIS — E113291 Type 2 diabetes mellitus with mild nonproliferative diabetic retinopathy without macular edema, right eye: Secondary | ICD-10-CM | POA: Diagnosis not present

## 2023-05-17 DIAGNOSIS — G4733 Obstructive sleep apnea (adult) (pediatric): Secondary | ICD-10-CM | POA: Diagnosis not present

## 2023-05-21 ENCOUNTER — Other Ambulatory Visit: Payer: Self-pay

## 2023-05-21 MED ORDER — TIRZEPATIDE 10 MG/0.5ML ~~LOC~~ SOAJ: 10.0000 mg | SUBCUTANEOUS | 3 refills | Status: DC

## 2023-05-31 ENCOUNTER — Other Ambulatory Visit: Payer: Self-pay | Admitting: Internal Medicine

## 2023-05-31 DIAGNOSIS — E113299 Type 2 diabetes mellitus with mild nonproliferative diabetic retinopathy without macular edema, unspecified eye: Secondary | ICD-10-CM

## 2023-06-22 DIAGNOSIS — B351 Tinea unguium: Secondary | ICD-10-CM | POA: Diagnosis not present

## 2023-06-22 DIAGNOSIS — E0840 Diabetes mellitus due to underlying condition with diabetic neuropathy, unspecified: Secondary | ICD-10-CM | POA: Diagnosis not present

## 2023-06-22 DIAGNOSIS — M79675 Pain in left toe(s): Secondary | ICD-10-CM | POA: Diagnosis not present

## 2023-07-20 ENCOUNTER — Other Ambulatory Visit: Payer: Self-pay

## 2023-07-20 DIAGNOSIS — E113299 Type 2 diabetes mellitus with mild nonproliferative diabetic retinopathy without macular edema, unspecified eye: Secondary | ICD-10-CM

## 2023-07-20 MED ORDER — INSULIN GLARGINE-YFGN 100 UNIT/ML ~~LOC~~ SOLN: 18.0000 [IU] | Freq: Every day | SUBCUTANEOUS | 3 refills | Status: DC

## 2023-07-23 DIAGNOSIS — Z1330 Encounter for screening examination for mental health and behavioral disorders, unspecified: Secondary | ICD-10-CM | POA: Diagnosis not present

## 2023-07-23 DIAGNOSIS — G4733 Obstructive sleep apnea (adult) (pediatric): Secondary | ICD-10-CM | POA: Diagnosis not present

## 2023-07-23 DIAGNOSIS — E1142 Type 2 diabetes mellitus with diabetic polyneuropathy: Secondary | ICD-10-CM | POA: Diagnosis not present

## 2023-08-13 DIAGNOSIS — L509 Urticaria, unspecified: Secondary | ICD-10-CM | POA: Diagnosis not present

## 2023-08-20 DIAGNOSIS — R197 Diarrhea, unspecified: Secondary | ICD-10-CM | POA: Diagnosis not present

## 2023-08-20 DIAGNOSIS — L509 Urticaria, unspecified: Secondary | ICD-10-CM | POA: Diagnosis not present

## 2023-08-20 DIAGNOSIS — Z794 Long term (current) use of insulin: Secondary | ICD-10-CM | POA: Diagnosis not present

## 2023-08-20 DIAGNOSIS — E1169 Type 2 diabetes mellitus with other specified complication: Secondary | ICD-10-CM | POA: Diagnosis not present

## 2023-08-20 DIAGNOSIS — G9331 Postviral fatigue syndrome: Secondary | ICD-10-CM | POA: Diagnosis not present

## 2023-08-27 DIAGNOSIS — G9331 Postviral fatigue syndrome: Secondary | ICD-10-CM | POA: Diagnosis not present

## 2023-08-27 DIAGNOSIS — R197 Diarrhea, unspecified: Secondary | ICD-10-CM | POA: Diagnosis not present

## 2023-09-07 ENCOUNTER — Other Ambulatory Visit: Payer: Self-pay | Admitting: Internal Medicine

## 2023-09-07 DIAGNOSIS — E1142 Type 2 diabetes mellitus with diabetic polyneuropathy: Secondary | ICD-10-CM | POA: Diagnosis not present

## 2023-09-07 DIAGNOSIS — E1169 Type 2 diabetes mellitus with other specified complication: Secondary | ICD-10-CM | POA: Diagnosis not present

## 2023-09-07 DIAGNOSIS — Z9884 Bariatric surgery status: Secondary | ICD-10-CM | POA: Diagnosis not present

## 2023-09-07 DIAGNOSIS — N400 Enlarged prostate without lower urinary tract symptoms: Secondary | ICD-10-CM | POA: Diagnosis not present

## 2023-09-07 DIAGNOSIS — E0840 Diabetes mellitus due to underlying condition with diabetic neuropathy, unspecified: Secondary | ICD-10-CM | POA: Diagnosis not present

## 2023-09-07 DIAGNOSIS — I10 Essential (primary) hypertension: Secondary | ICD-10-CM | POA: Diagnosis not present

## 2023-09-07 DIAGNOSIS — M79675 Pain in left toe(s): Secondary | ICD-10-CM | POA: Diagnosis not present

## 2023-09-07 DIAGNOSIS — B351 Tinea unguium: Secondary | ICD-10-CM | POA: Diagnosis not present

## 2023-09-10 DIAGNOSIS — G4733 Obstructive sleep apnea (adult) (pediatric): Secondary | ICD-10-CM | POA: Diagnosis not present

## 2023-09-24 ENCOUNTER — Ambulatory Visit: Payer: BC Managed Care – PPO | Admitting: Internal Medicine

## 2023-09-24 ENCOUNTER — Encounter: Payer: Self-pay | Admitting: Internal Medicine

## 2023-09-24 VITALS — BP 126/84 | HR 60 | Ht 72.0 in | Wt 308.0 lb

## 2023-09-24 DIAGNOSIS — N400 Enlarged prostate without lower urinary tract symptoms: Secondary | ICD-10-CM | POA: Diagnosis not present

## 2023-09-24 DIAGNOSIS — E113299 Type 2 diabetes mellitus with mild nonproliferative diabetic retinopathy without macular edema, unspecified eye: Secondary | ICD-10-CM

## 2023-09-24 DIAGNOSIS — Z794 Long term (current) use of insulin: Secondary | ICD-10-CM | POA: Diagnosis not present

## 2023-09-24 LAB — POCT GLYCOSYLATED HEMOGLOBIN (HGB A1C): Hemoglobin A1C: 7 % — AB (ref 4.0–5.6)

## 2023-09-24 MED ORDER — TIRZEPATIDE 12.5 MG/0.5ML ~~LOC~~ SOAJ: 12.5000 mg | SUBCUTANEOUS | 3 refills | Status: DC

## 2023-09-24 MED ORDER — NOVOLOG FLEXPEN 100 UNIT/ML ~~LOC~~ SOPN: PEN_INJECTOR | SUBCUTANEOUS | 3 refills | Status: AC

## 2023-09-24 MED ORDER — INSULIN GLARGINE-YFGN 100 UNIT/ML ~~LOC~~ SOLN: 16.0000 [IU] | Freq: Every day | SUBCUTANEOUS | 4 refills | Status: AC

## 2023-09-24 MED ORDER — TAMSULOSIN HCL 0.4 MG PO CAPS
0.4000 mg | ORAL_CAPSULE | Freq: Every day | ORAL | 3 refills | Status: AC
Start: 2023-09-24 — End: ?

## 2023-09-24 NOTE — Progress Notes (Unsigned)
 Name: Todd Delgado  Age/ Sex: 63 y.o., male   MRN/ DOB: [id number], 06-17-1960     PCP: Emaline Handsome, MD   Reason for Endocrinology Evaluation: Type 2 Diabetes Mellitus  Initial Endocrine Consultative Visit: 12/10/2018    PATIENT IDENTIFIER: Todd Delgado is a 63 y.o. male with a past medical history of T2DM, S/P Weight loss sx (04/2017), OSA, HTN and dyslipidemia . The patient has followed with Endocrinology clinic since 12/10/2018 for consultative assistance with management of his diabetes.  DIABETIC HISTORY:  Todd Delgado was diagnosed with T2DM in 1998. He has been on oral glycemic agents in the past with some reported intolerance. (Metformin- unclear, Byetta - halitosis and GI side effects ). His hemoglobin A1c has ranged from 7.0 % in 2018, peaking at 9.0%      Weight Loss :  Had a lap band sx  in 2009 and lost ~50 lbs weight loss. Pt had a revision of Lap Band with Roux-en-Y GB due to plataeu in weigh which results in a ~ 95 lbs loss.     On his initial visit to our clinic his A1c was 7.6%, was on MDI regimen and Invokana.   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     Today (09/24/2023): Todd Delgado is here for a follow up on diabetes care. He checks his blood sugars multiple times daily, through CGM. The patient has had hypoglycemic episodes since the last clinic visit, which typically occur at night    He continues to follow-up with the bariatric clinic in [place] as well as seen the nutritional counselor Patient continues to follow-up with neurology for OSA, mild  He had a follow-up with his PCP through [facility] /20 09/2023  Podiatry - Dr. Mabelene Savannah  ( [facility] )   His frequency has improved with tamsulosin  Denies nausea, vomiting  Denies recent constipation or  diarrhea except for E.coli infection after a trip, required Abx and anatacid      HOME DIABETES REGIMEN:  Farxiga  10 mg daily Mounjaro  10 mg weekly Semglee  18 units daily Novolog  8/8/6 units  CF: Novolog  (BG-130/30) Tamsulosin  0.4 mg bedtime   CONTINUOUS GLUCOSE MONITORING RECORD INTERPRETATION    Dates of Recording: 4/29-5/04/2024  Sensor description: dexcom Results statistics:   CGM use % of time 91  Average and SD 157/52  Time in range 69 %  % Time Above 180 23  % Time above 250 6  % Time Below target 2    Glycemic patterns summary :   Hypoglycemic episodes occurred : variable    Overnight periods: mostly optimal     DIABETIC COMPLICATIONS: Microvascular complications:   mild NPDR present -requiring  treatment  Denies: neuropathy, CKD Last eye exam: Completed 02/14/2021   Macrovascular complications:    Denies: CAD, PVD, CVA    HISTORY:  Past Medical History:  Past Medical History:  Diagnosis Date   Chest pain    CHF (congestive heart failure) (HCC)    Chronic kidney disease    Diabetes mellitus without complication (HCC)    Hyperlipemia    Hypertension    Impotence    Morbid obesity (HCC)    Post-herpetic polyneuropathy    Sleep apnea    Sleep apnea    Vitamin D deficiency    Past Surgical History:  Past Surgical History:  Procedure Laterality Date  BARIATRIC SURGERY  03/2017   Lap band to Roux-en-Y gastric bypass    LAPAROSCOPIC GASTRIC BANDING  July 2009    Dr. Mickey Alar   Social History:  reports that he has never smoked. He has never used smokeless tobacco. He reports that he does not drink alcohol and does not use drugs. Family History:  Family History  Problem Relation Age of Onset   Myasthenia gravis Mother    Heart disease Father    Diabetes Father    Hypertension Father    Heart failure Father    Heart disease Other      HOME MEDICATIONS: Allergies as of 09/24/2023       Reactions   Spironolactone  Other (See Comments)   gynecomastia        Medication List        Accurate as of Sep 24, 2023 12:33 PM. If you have any questions, ask your nurse or doctor.          STOP taking these medications    5-HTP 100 MG Caps Stopped by: Camilla Cedar Eesa Justiss   famotidine 20 MG tablet Commonly known as: PEPCID Stopped by: Adonijah Baena J Mertis Mosher       TAKE these medications    amLODipine 5 MG tablet Commonly known as: NORVASC   amLODipine 10 MG tablet Commonly known as: NORVASC Take 10 mg by mouth daily.   ascorbic acid 500 MG tablet Commonly known as: VITAMIN C Take 500 mg by mouth daily.   atorvastatin  20 MG tablet Commonly known as: LIPITOR Take 1 tablet (20 mg total) by mouth daily.   b complex vitamins tablet Take 1 tablet by mouth daily.   BIOTIN PO Take 75 mg by mouth.   buPROPion 150 MG 12 hr tablet Commonly known as: ZYBAN Take 150 mg by mouth 2 (two) times daily.   buPROPion 300 MG 24 hr tablet Commonly known as: WELLBUTRIN XL Take 300 mg by mouth daily.   cholecalciferol 1000 units tablet Commonly known as: VITAMIN D Take 2,000 Units by mouth daily.   CINNAMON PO Take 1,000 mg by mouth.   cloNIDine 0.3 mg/24hr patch Commonly known as: CATAPRES - Dosed in mg/24 hr Place 0.3 mg onto the skin once a week.   CRANBERRY PO Take by mouth.   dapagliflozin  propanediol 10 MG Tabs tablet Commonly known as: Farxiga  Take 1 tablet (10 mg total) by mouth daily.   Dexcom G7 Sensor Misc 1 Device by Does not apply route as directed.   insulin  glargine-yfgn 100 UNIT/ML injection Commonly known as: Semglee  (yfgn) Inject 0.18 mLs (18 Units total) into the skin daily.   losartan  50 MG tablet Commonly known as: COZAAR  TAKE 1 TABLET(50 MG) BY MOUTH TWICE DAILY AS DIRECTED What changed: See the new instructions.   multivitamin with minerals tablet Take 1 tablet by mouth daily.   Nebivolol  HCl 20 MG Tabs Commonly known as: Bystolic  Take 1 tablet (20 mg total) by mouth daily.   NovoLOG  FlexPen 100 UNIT/ML FlexPen Generic drug: insulin  aspart Max  daily 50 units   PROBIOTIC DAILY PO Take by mouth daily.   sildenafil 50 MG tablet Commonly known as: VIAGRA Take 50 mg by mouth daily as needed for erectile dysfunction.   tadalafil  20 MG tablet Commonly known as: CIALIS  Take 1 tablet (20 mg total) by mouth daily as needed for erectile dysfunction.   tamsulosin  0.4 MG Caps capsule Commonly known as: FLOMAX  Take 1 capsule (0.4 mg total) by mouth at  bedtime.   terbinafine 250 MG tablet Commonly known as: LAMISIL Take 250 mg by mouth daily.   tirzepatide  10 MG/0.5ML Pen Commonly known as: MOUNJARO  Inject 10 mg into the skin once a week.   TRUEplus Pen Needles 31G X 6 MM Misc Generic drug: Insulin  Pen Needle 1 Device by Does not apply route in the morning, at noon, in the evening, and at bedtime.   TRUEplus 5-Bevel Pen Needles 31G X 5 MM Misc Generic drug: Insulin  Pen Needle USE AS DIRCTED TO INJECT INSULIN  EVERY MORNING, NOON, EVERY EVENING, AND EVERY NIGHT AT BEDTIME   UNABLE TO FIND Take by mouth daily. Med Name: MEGA RED  Take as directed.         OBJECTIVE:   Vital Signs: BP 126/84 (BP Location: Left Arm, Patient Position: Sitting, Cuff Size: Normal)   Pulse 60   Ht 6' (1.829 m)   Wt (!) 308 lb (139.7 kg)   SpO2 99%   BMI 41.77 kg/m   Wt Readings from Last 3 Encounters:  09/24/23 (!) 308 lb (139.7 kg)  04/06/23 (!) 307 lb (139.3 kg)  10/06/22 (!) 301 lb (136.5 kg)     Exam: General: Pt appears well and is in NAD  Lungs: Clear with good BS bilat   Heart: RRR   Extremities: 1+  pretibial edema.   Neuro: MS is good with appropriate affect, pt is alert and Ox3     DM foot exam: Per podiatry 02/2023    DATA REVIEWED:  Lab Results  Component Value Date   HGBA1C 7.0 (A) 09/24/2023   HGBA1C 6.8 (A) 04/06/2023   HGBA1C 6.8 (A) 10/06/2022    Labs through Care Everywhere/14/2025 Albumin/CR ratio 19 A1c 7.0%    03/12/2023 BUN 18 Creatinine 1.29 GFR 63 Tg 63 HDL 50 LDL  60    ASSESSMENT / PLAN / RECOMMENDATIONS:   1) Type 2 Diabetes Mellitus, Optimally controlled, with retinopathic complications - Most recent A1c of 7.0 %. Goal A1c < 7.0 %.     -A1c continues to be optimal -In reviewing CGM data, patient has been noted with hypoglycemia overnight, will decrease Semglee  -Will increase Mounjaro  as below -He continues to hold Farxiga  but not as often due to urinary frequency, tamsulosin  has helped with the frequency    MEDICATIONS: Continue Farxiga  10 mg daily Increase Mounjaro  10 mg weekly Decrease Semglee  18  units daily  Continue Novolog  8/8/6 units  CF: Novolog  ( BG -130/30)     EDUCATION / INSTRUCTIONS: BG monitoring instructions: Patient is instructed to check his blood sugars 4 times a day, before meals and bedtime . Call [facility] if: BG persistently < 70  I reviewed the Rule of 15 for the treatment of hypoglycemia in detail with the patient. Literature supplied.   2) BPH:  -Tamsulosin  has helped with urinary frequency -Will continue  Medication Continue tamsulosin  0.4 mg bedtime   F/U in 6 months     Signed electronically by: Todd Bail, MD  [facility] Endocrinology  [facility] [address] [place] [place], [place] [address] Phone: [phone] FAX: [phone]   CC: Emaline Handsome, MD [address] [place] [address] Phone: [phone]  Fax: [phone]  Return to Endocrinology clinic as below: No future appointments.

## 2023-09-24 NOTE — Patient Instructions (Addendum)
-   Decrease  Semglee  16 units daily  - Take Novolog  6 units with each meal  - Continue Farxiga  10 mg daily   - Increase Mounjaro  12.5  mg weekly  - Novolog  correctional insulin : ADD extra units on insulin  to your meal-time Novolog  dose if your blood sugars are higher than 160. Use the scale below to help guide you:   Blood sugar before meal Number of units to inject  Less than 160 0 unit  161 -  190 1 units  191 -  220 2 units  221 -  250 3 units  251 -  280 4 units  281 -  310 5 units  311 -  340 6 units  341 - 370  7 units     HOW TO TREAT LOW BLOOD SUGARS (Blood sugar LESS THAN 70 MG/DL) Please follow the RULE OF 15 for the treatment of hypoglycemia treatment (when your (blood sugars are less than 70 mg/dL)   STEP 1: Take 15 grams of carbohydrates when your blood sugar is low, which includes:  3-4 GLUCOSE TABS  OR 3-4 OZ OF JUICE OR REGULAR SODA OR ONE TUBE OF GLUCOSE GEL    STEP 2: RECHECK blood sugar in 15 MINUTES STEP 3: If your blood sugar is still low at the 15 minute recheck --> then, go back to STEP 1 and treat AGAIN with another 15 grams of carbohydrates.

## 2023-09-28 ENCOUNTER — Ambulatory Visit: Payer: BC Managed Care – PPO | Admitting: Internal Medicine

## 2023-11-02 DIAGNOSIS — G4733 Obstructive sleep apnea (adult) (pediatric): Secondary | ICD-10-CM | POA: Diagnosis not present

## 2023-11-05 ENCOUNTER — Other Ambulatory Visit: Payer: Self-pay

## 2023-11-05 MED ORDER — TIRZEPATIDE 12.5 MG/0.5ML ~~LOC~~ SOAJ
12.5000 mg | SUBCUTANEOUS | 3 refills | Status: DC
Start: 2023-11-05 — End: 2024-03-31

## 2023-11-05 NOTE — Telephone Encounter (Signed)
 [facility] sent refill request through fax.

## 2023-11-19 DIAGNOSIS — G4733 Obstructive sleep apnea (adult) (pediatric): Secondary | ICD-10-CM | POA: Diagnosis not present

## 2023-12-02 DIAGNOSIS — G4733 Obstructive sleep apnea (adult) (pediatric): Secondary | ICD-10-CM | POA: Diagnosis not present

## 2024-01-02 DIAGNOSIS — G4733 Obstructive sleep apnea (adult) (pediatric): Secondary | ICD-10-CM | POA: Diagnosis not present

## 2024-01-18 DIAGNOSIS — M79675 Pain in left toe(s): Secondary | ICD-10-CM | POA: Diagnosis not present

## 2024-01-18 DIAGNOSIS — B351 Tinea unguium: Secondary | ICD-10-CM | POA: Diagnosis not present

## 2024-01-18 DIAGNOSIS — E0840 Diabetes mellitus due to underlying condition with diabetic neuropathy, unspecified: Secondary | ICD-10-CM | POA: Diagnosis not present

## 2024-01-21 ENCOUNTER — Other Ambulatory Visit: Payer: Self-pay | Admitting: Internal Medicine

## 2024-01-21 DIAGNOSIS — E113299 Type 2 diabetes mellitus with mild nonproliferative diabetic retinopathy without macular edema, unspecified eye: Secondary | ICD-10-CM

## 2024-02-02 DIAGNOSIS — G4733 Obstructive sleep apnea (adult) (pediatric): Secondary | ICD-10-CM | POA: Diagnosis not present

## 2024-03-07 DIAGNOSIS — Z9884 Bariatric surgery status: Secondary | ICD-10-CM | POA: Diagnosis not present

## 2024-03-07 DIAGNOSIS — K912 Postsurgical malabsorption, not elsewhere classified: Secondary | ICD-10-CM | POA: Diagnosis not present

## 2024-03-14 DIAGNOSIS — E785 Hyperlipidemia, unspecified: Secondary | ICD-10-CM | POA: Diagnosis not present

## 2024-03-14 DIAGNOSIS — E1169 Type 2 diabetes mellitus with other specified complication: Secondary | ICD-10-CM | POA: Diagnosis not present

## 2024-03-14 DIAGNOSIS — Z0001 Encounter for general adult medical examination with abnormal findings: Secondary | ICD-10-CM | POA: Diagnosis not present

## 2024-03-14 DIAGNOSIS — G4733 Obstructive sleep apnea (adult) (pediatric): Secondary | ICD-10-CM | POA: Diagnosis not present

## 2024-03-14 DIAGNOSIS — I10 Essential (primary) hypertension: Secondary | ICD-10-CM | POA: Diagnosis not present

## 2024-03-14 DIAGNOSIS — F509 Eating disorder, unspecified: Secondary | ICD-10-CM | POA: Diagnosis not present

## 2024-03-14 DIAGNOSIS — N400 Enlarged prostate without lower urinary tract symptoms: Secondary | ICD-10-CM | POA: Diagnosis not present

## 2024-03-21 DIAGNOSIS — Z9884 Bariatric surgery status: Secondary | ICD-10-CM | POA: Diagnosis not present

## 2024-03-21 DIAGNOSIS — E1169 Type 2 diabetes mellitus with other specified complication: Secondary | ICD-10-CM | POA: Diagnosis not present

## 2024-03-21 DIAGNOSIS — K909 Intestinal malabsorption, unspecified: Secondary | ICD-10-CM | POA: Diagnosis not present

## 2024-03-21 DIAGNOSIS — E1142 Type 2 diabetes mellitus with diabetic polyneuropathy: Secondary | ICD-10-CM | POA: Diagnosis not present

## 2024-03-21 DIAGNOSIS — I10 Essential (primary) hypertension: Secondary | ICD-10-CM | POA: Diagnosis not present

## 2024-03-31 ENCOUNTER — Encounter: Payer: Self-pay | Admitting: Internal Medicine

## 2024-03-31 ENCOUNTER — Ambulatory Visit: Admitting: Internal Medicine

## 2024-03-31 VITALS — BP 152/84 | Ht 72.0 in | Wt 311.0 lb

## 2024-03-31 DIAGNOSIS — Z794 Long term (current) use of insulin: Secondary | ICD-10-CM | POA: Diagnosis not present

## 2024-03-31 DIAGNOSIS — N400 Enlarged prostate without lower urinary tract symptoms: Secondary | ICD-10-CM | POA: Diagnosis not present

## 2024-03-31 DIAGNOSIS — E113299 Type 2 diabetes mellitus with mild nonproliferative diabetic retinopathy without macular edema, unspecified eye: Secondary | ICD-10-CM

## 2024-03-31 LAB — POCT GLYCOSYLATED HEMOGLOBIN (HGB A1C): Hemoglobin A1C: 6.7 % — AB (ref 4.0–5.6)

## 2024-03-31 MED ORDER — TIRZEPATIDE 15 MG/0.5ML ~~LOC~~ SOAJ: 15.0000 mg | SUBCUTANEOUS | 3 refills | Status: DC

## 2024-03-31 NOTE — Patient Instructions (Addendum)
-   Continue  Semglee  20 units daily  - Take Novolog  8 units with each meal  - Continue Farxiga  10 mg , half a tablet daily  - Increase Mounjaro  15  mg weekly  - Novolog  correctional insulin : ADD extra units on insulin  to your meal-time Novolog  dose if your blood sugars are higher than 160. Use the scale below to help guide you:   Blood sugar before meal Number of units to inject  Less than 160 0 unit  161 -  190 1 units  191 -  220 2 units  221 -  250 3 units  251 -  280 4 units  281 -  310 5 units  311 -  340 6 units  341 -  370  7 units     HOW TO TREAT LOW BLOOD SUGARS (Blood sugar LESS THAN 70 MG/DL) Please follow the RULE OF 15 for the treatment of hypoglycemia treatment (when your (blood sugars are less than 70 mg/dL)   STEP 1: Take 15 grams of carbohydrates when your blood sugar is low, which includes:  3-4 GLUCOSE TABS  OR 3-4 OZ OF JUICE OR REGULAR SODA OR ONE TUBE OF GLUCOSE GEL    STEP 2: RECHECK blood sugar in 15 MINUTES STEP 3: If your blood sugar is still low at the 15 minute recheck --> then, go back to STEP 1 and treat AGAIN with another 15 grams of carbohydrates.

## 2024-03-31 NOTE — Progress Notes (Unsigned)
 Name: Todd Delgado  Age/ Sex: 63 y.o., male   MRN/ DOB: [id number], 1960-12-07     PCP: Sophronia Ozell BROCKS, MD   Reason for Endocrinology Evaluation: Type 2 Diabetes Mellitus  Initial Endocrine Consultative Visit: 12/10/2018    PATIENT IDENTIFIER: [name] is a 63 y.o. male with a past medical history of T2DM, S/P Weight loss sx (04/2017), OSA, HTN and dyslipidemia . The patient has followed with Endocrinology clinic since 12/10/2018 for consultative assistance with management of his diabetes.  DIABETIC HISTORY:  Mr. Palmieri was diagnosed with T2DM in 1998. He has been on oral glycemic agents in the past with some reported intolerance. (Metformin- unclear, Byetta - halitosis and GI side effects ). His hemoglobin A1c has ranged from 7.0 % in 2018, peaking at 9.0%      Weight Loss :  Had a lap band sx  in 2009 and lost ~50 lbs weight loss. Pt had a revision of Lap Band with Roux-en-Y GB due to plataeu in weigh which results in a ~ 95 lbs loss.     On his initial visit to our clinic his A1c was 7.6%, was on MDI regimen and Invokana.   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     Today (03/31/2024): [name] is here for a follow up on diabetes care. He checks his blood sugars multiple times daily, through CGM. The patient has had hypoglycemic episodes since the last clinic visit, which typically occur at night    He continues to follow-up with the bariatric clinic in [place]   Patient continues to follow-up with neurology for OSA, mild  He had a follow-up with his PCP through [facility]   Podiatry - Dr. Wendi Hai  ( [facility] )  Weight has been stable Urinary frequency is stable  No nausea  No changes in bowel movements     HOME DIABETES REGIMEN:  Farxiga  10 mg daily- half tablet Mounjaro  12.5 mg weekly Semglee  16 units daily- 20 units   Novolog  6 units with each - 8 units  CF: Novolog  (BG-130/30) Tamsulosin  0.4 mg bedtime   CONTINUOUS GLUCOSE MONITORING RECORD INTERPRETATION    Dates of Recording: 11/1-11/14/2025  Sensor description: dexcom Results statistics:   CGM use % of time 96  Average and SD 154/49  Time in range 70 %  % Time Above 180 26  % Time above 250 3  % Time Below target 1    Glycemic patterns summary : BGs are optimal overnight and fluctuate during the day  Hypoglycemic episodes occurred : Overnight  Overnight periods: mostly optimal     DIABETIC COMPLICATIONS: Microvascular complications:   mild NPDR present -requiring  treatment  Denies: neuropathy, CKD Last eye exam: Completed 02/14/2021   Macrovascular complications:    Denies: CAD, PVD, CVA    HISTORY:  Past Medical History:  Past Medical History:  Diagnosis Date   Chest pain    CHF (congestive heart failure) (HCC)    Chronic kidney disease    Diabetes mellitus without complication (HCC)    Hyperlipemia    Hypertension    Impotence    Morbid obesity (HCC)    Post-herpetic polyneuropathy    Sleep apnea    Sleep apnea    Vitamin D deficiency    Past Surgical History:  Past Surgical History:  Procedure Laterality Date  BARIATRIC SURGERY  03/2017   Lap band to Roux-en-Y gastric bypass    LAPAROSCOPIC GASTRIC BANDING  July 2009    Dr. MARLA Masker   Social History:  reports that he has never smoked. He has never used smokeless tobacco. He reports that he does not drink alcohol and does not use drugs. Family History:  Family History  Problem Relation Age of Onset   Myasthenia gravis Mother    Heart disease Father    Diabetes Father    Hypertension Father    Heart failure Father    Heart disease Other      HOME MEDICATIONS: Allergies as of 03/31/2024       Reactions   Spironolactone  Other (See Comments)   gynecomastia        Medication List        Accurate as of March 31, 2024  3:26 PM. If you  have any questions, ask your nurse or doctor.          STOP taking these medications    tirzepatide  12.5 MG/0.5ML Pen Commonly known as: MOUNJARO  Replaced by: tirzepatide  15 MG/0.5ML Pen Stopped by: Donell PARAS Blakley Michna       TAKE these medications    amLODipine 10 MG tablet Commonly known as: NORVASC Take 10 mg by mouth daily. What changed: Another medication with the same name was removed. Continue taking this medication, and follow the directions you see here. Changed by: Korbin Notaro J Camella Seim   ascorbic acid 500 MG tablet Commonly known as: VITAMIN C Take 500 mg by mouth daily.   atorvastatin  20 MG tablet Commonly known as: LIPITOR Take 1 tablet (20 mg total) by mouth daily.   b complex vitamins tablet Take 1 tablet by mouth daily.   BIOTIN PO Take 75 mg by mouth.   buPROPion 150 MG 12 hr tablet Commonly known as: ZYBAN Take 150 mg by mouth 2 (two) times daily.   buPROPion 300 MG 24 hr tablet Commonly known as: WELLBUTRIN XL Take 300 mg by mouth daily.   cholecalciferol 1000 units tablet Commonly known as: VITAMIN D Take 2,000 Units by mouth daily.   CINNAMON PO Take 1,000 mg by mouth.   cloNIDine 0.3 mg/24hr patch Commonly known as: CATAPRES - Dosed in mg/24 hr Place 0.3 mg onto the skin once a week.   CRANBERRY PO Take by mouth.   dapagliflozin  propanediol 10 MG Tabs tablet Commonly known as: Farxiga  Take 1 tablet (10 mg total) by mouth daily.   Dexcom G7 Sensor Misc Change sensor every 10 days   insulin  glargine-yfgn 100 UNIT/ML injection Commonly known as: Semglee  (yfgn) Inject 0.16 mLs (16 Units total) into the skin daily.   losartan  50 MG tablet Commonly known as: COZAAR  TAKE 1 TABLET(50 MG) BY MOUTH TWICE DAILY AS DIRECTED What changed: See the new instructions.   multivitamin with minerals tablet Take 1 tablet by mouth daily.   Nebivolol  HCl 20 MG Tabs Commonly known as: Bystolic  Take 1 tablet (20 mg total) by mouth daily.    NovoLOG  FlexPen 100 UNIT/ML FlexPen Generic drug: insulin  aspart Max daily 50 units   PROBIOTIC DAILY PO Take by mouth daily.   sildenafil 50 MG tablet Commonly known as: VIAGRA Take 50 mg by mouth daily as needed for erectile dysfunction.   tadalafil  20 MG tablet Commonly known as: CIALIS  Take 1 tablet (20 mg total) by mouth daily as needed for erectile dysfunction.   tamsulosin  0.4 MG Caps capsule Commonly known as: FLOMAX  Take  1 capsule (0.4 mg total) by mouth at bedtime.   terbinafine 250 MG tablet Commonly known as: LAMISIL Take 250 mg by mouth daily.   tirzepatide  15 MG/0.5ML Pen Commonly known as: MOUNJARO  Inject 15 mg into the skin once a week. Replaces: tirzepatide  12.5 MG/0.5ML Pen Started by: Donell PARAS Kavita Bartl   TRUEplus Pen Needles 31G X 6 MM Misc Generic drug: Insulin  Pen Needle 1 Device by Does not apply route in the morning, at noon, in the evening, and at bedtime.   TRUEplus 5-Bevel Pen Needles 31G X 5 MM Misc Generic drug: Insulin  Pen Needle USE AS DIRCTED TO INJECT INSULIN  EVERY MORNING, NOON, EVERY EVENING, AND EVERY NIGHT AT BEDTIME   UNABLE TO FIND Take by mouth daily. Med Name: MEGA RED  Take as directed.         OBJECTIVE:   Vital Signs: BP (!) 152/84   Ht 6' (1.829 m)   Wt (!) 311 lb (141.1 kg)   BMI 42.18 kg/m   Wt Readings from Last 3 Encounters:  03/31/24 (!) 311 lb (141.1 kg)  09/24/23 (!) 308 lb (139.7 kg)  04/06/23 (!) 307 lb (139.3 kg)     Exam: General: Pt appears well and is in NAD  Lungs: Clear with good BS bilat   Heart: RRR   Extremities: 1+  pretibial edema.   Neuro: MS is good with appropriate affect, pt is alert and Ox3     DM foot exam: Per podiatry 03/2024    DATA REVIEWED:  Lab Results  Component Value Date   HGBA1C 6.7 (A) 03/31/2024   HGBA1C 7.0 (A) 09/24/2023   HGBA1C 6.8 (A) 04/06/2023    Labs through Care Everywhere 03/07/2024   BUN 24 Creatinine 1.27 GFR 63 Tg 53 HDL 45 LDL  42    ASSESSMENT / PLAN / RECOMMENDATIONS:   1) Type 2 Diabetes Mellitus, Optimally controlled, with retinopathic complications - Most recent A1c of 6.7 %. Goal A1c < 7.0 %.     -A1c continues to be optimal -Continues with insulin -carbohydrate mismatch on CGM download -Will increase Mounjaro  as below -He continues to use half a tablet of Farxiga  - No changes to insulin  regimen   MEDICATIONS: Continue Farxiga  10 mg, half a tablet daily Increase Mounjaro  15 mg weekly Continue Semglee  20  units daily  Continue Novolog  8 units TIDQAC CF: Novolog  ( BG -130/30) TIDQAC    EDUCATION / INSTRUCTIONS: BG monitoring instructions: Patient is instructed to check his blood sugars 4 times a day, before meals and bedtime . Call [facility] if: BG persistently < 70  I reviewed the Rule of 15 for the treatment of hypoglycemia in detail with the patient. Literature supplied.   2) BPH:  - Tamsulosin  has helped with frequency, no change   Medication Continue tamsulosin  0.4 mg bedtime   3.) HTN:  - This is out of control today, patient asymptomatic - He tends to take his medications after exercise, this morning he forgot it, I did advise the patient that he could switch his antihypertensive to evening time that way he can have it consistently at the same time every day   F/U in 6 months     Signed electronically by: Stefano Redgie Butts, MD  [facility]  [facility] [address] [place] [place], [place] [address] Phone: [phone] FAX: [phone]   CC: Sophronia Ozell BROCKS, MD [address] [place] [address] Phone: [phone]  Fax: [phone]  Return  to Endocrinology clinic as below: No future appointments.

## 2024-04-29 ENCOUNTER — Other Ambulatory Visit: Payer: Self-pay

## 2024-04-29 MED ORDER — TIRZEPATIDE 15 MG/0.5ML ~~LOC~~ SOAJ: 15.0000 mg | SUBCUTANEOUS | 3 refills | Status: AC

## 2024-09-29 ENCOUNTER — Ambulatory Visit: Admitting: Internal Medicine
